# Patient Record
Sex: Female | Born: 1970 | Race: Black or African American | Hispanic: No | Marital: Married | Smoking: Never smoker
Health system: Southern US, Community
[De-identification: ages and names within clinical notes are randomized; demographics above are authoritative.]

## PROBLEM LIST (undated history)

## (undated) DIAGNOSIS — I10 Essential (primary) hypertension: Secondary | ICD-10-CM

## (undated) DIAGNOSIS — G4730 Sleep apnea, unspecified: Secondary | ICD-10-CM

## (undated) DIAGNOSIS — K219 Gastro-esophageal reflux disease without esophagitis: Secondary | ICD-10-CM

## (undated) DIAGNOSIS — G709 Myoneural disorder, unspecified: Secondary | ICD-10-CM

## (undated) DIAGNOSIS — T8859XA Other complications of anesthesia, initial encounter: Secondary | ICD-10-CM

## (undated) DIAGNOSIS — F419 Anxiety disorder, unspecified: Secondary | ICD-10-CM

## (undated) DIAGNOSIS — N189 Chronic kidney disease, unspecified: Secondary | ICD-10-CM

## (undated) DIAGNOSIS — D649 Anemia, unspecified: Secondary | ICD-10-CM

## (undated) DIAGNOSIS — K439 Ventral hernia without obstruction or gangrene: Secondary | ICD-10-CM

## (undated) DIAGNOSIS — M1990 Unspecified osteoarthritis, unspecified site: Secondary | ICD-10-CM

## (undated) DIAGNOSIS — E119 Type 2 diabetes mellitus without complications: Secondary | ICD-10-CM

## (undated) DIAGNOSIS — Z87442 Personal history of urinary calculi: Secondary | ICD-10-CM

## (undated) DIAGNOSIS — J42 Unspecified chronic bronchitis: Secondary | ICD-10-CM

## (undated) HISTORY — DX: Gastro-esophageal reflux disease without esophagitis: K21.9

## (undated) HISTORY — DX: Essential (primary) hypertension: I10

## (undated) HISTORY — DX: Sleep apnea, unspecified: G47.30

## (undated) HISTORY — PX: CYST EXCISION: SHX5701

## (undated) HISTORY — PX: BREAST SURGERY: SHX581

## (undated) HISTORY — DX: Type 2 diabetes mellitus without complications: E11.9

## (undated) HISTORY — PX: SPINE SURGERY: SHX786

---

## 2009-09-17 ENCOUNTER — Ambulatory Visit (HOSPITAL_BASED_OUTPATIENT_CLINIC_OR_DEPARTMENT_OTHER): Admission: RE | Admit: 2009-09-17 | Discharge: 2009-09-18 | Payer: Self-pay | Admitting: Specialist

## 2010-08-28 LAB — POCT I-STAT, CHEM 8
BUN: 9 mg/dL (ref 6–23)
Calcium, Ion: 1.11 mmol/L — ABNORMAL LOW (ref 1.12–1.32)
Chloride: 106 mEq/L (ref 96–112)
Potassium: 3.8 mEq/L (ref 3.5–5.1)

## 2010-08-28 LAB — GLUCOSE, CAPILLARY
Glucose-Capillary: 139 mg/dL — ABNORMAL HIGH (ref 70–99)
Glucose-Capillary: 140 mg/dL — ABNORMAL HIGH (ref 70–99)
Glucose-Capillary: 222 mg/dL — ABNORMAL HIGH (ref 70–99)

## 2014-09-11 ENCOUNTER — Encounter: Payer: Self-pay | Admitting: Dietician

## 2014-09-11 ENCOUNTER — Encounter: Payer: BLUE CROSS/BLUE SHIELD | Attending: Internal Medicine | Admitting: Dietician

## 2014-09-11 VITALS — Ht 64.0 in | Wt 225.0 lb

## 2014-09-11 DIAGNOSIS — Z713 Dietary counseling and surveillance: Secondary | ICD-10-CM | POA: Diagnosis not present

## 2014-09-11 DIAGNOSIS — Z6838 Body mass index (BMI) 38.0-38.9, adult: Secondary | ICD-10-CM | POA: Diagnosis not present

## 2014-09-11 DIAGNOSIS — E1165 Type 2 diabetes mellitus with hyperglycemia: Secondary | ICD-10-CM | POA: Diagnosis not present

## 2014-09-11 DIAGNOSIS — E669 Obesity, unspecified: Secondary | ICD-10-CM | POA: Diagnosis present

## 2014-09-11 DIAGNOSIS — IMO0002 Reserved for concepts with insufficient information to code with codable children: Secondary | ICD-10-CM

## 2014-09-11 NOTE — Patient Instructions (Signed)
Slow down your eating Consider only eating at the table or bar without TV.  Music is fine. Rethink what you drink.  Avoid drinking carbohydrate. Take medication as MD prescribed.  Take Janumet with meals.  Be aware of emotional eating.  What can I do instead? Be mindful of portion sizes. Choose baked instead of fried. Regularly spaced spaced meals and snacks [facility]

## 2014-09-11 NOTE — Progress Notes (Addendum)
  Medical Nutrition Therapy:  Appt start time: 1400 end time:  1530.   Assessment:  Primary concerns today:   Patient would like to get better control of eating  To lose weight and improve blood sugar. Patient with hx of type 2 diabetes since 2006.  Her last HgbA1C was 11.0%.  Patient also has a hx of HTN and vitamin D deficiency.  CBG 265 fasting 2 days ago.  Patient weighed 270 lbs "years ago" and lost to 200 lbs and regained part of her weight.  Patient has considered weight loss surgery.  Patient works 3rd shift at [employer].  Patient lives alone.   TANITA  BODY COMP RESULTS 09/11/14  225 lbs   BMI (kg/m^2) 38.6   Fat Mass (lbs) 112.5   Fat Free Mass (lbs) 112.5   Total Body Water (lbs) 82.5    Preferred Learning Style:   No preference indicated   Learning Readiness:   Ready  MEDICATIONS: see list which includes Janumet, Amaryl, and    DIETARY INTAKE: Patient states that she has been eating a lot lately "just to be eating".  Recent break up with boyfriend.  Patient eats out most of the time.  Portions are large. 24-hr recall:  B ( AM): occasional pancakes, eggs, cheese, bacon or bacon egg and cheese biscuit and hash browns or bacon egg and cheese croissant  Snk ( AM):  L ( PM): 2 hot dogs with chips Snk ( PM):  Snack machine at work- chips or honey bun D ( PM): chicken and gravy and mashed potatoes and gravy and green beans and yams Snk ( PM): chocolate Beverages: dislikes water, regular soda 16 oz bid, sweet tea, milkshakes  Usual physical activity: [employer] membership- water aerobics, treadmill, and track for 30-60 minutes 5-6 days per week.  Estimated energy needs: 1400ories 158carbohydrates 105protein 39 fat  Progress Towards Goal(s):  In progress.   Nutritional Diagnosis:  NB-1.1 Food and nutrition-related knowledge deficit As related to balance of carbohydrate, protein, and fat.  As evidenced by uncontrolled type 2 diabetes.    Intervention:  Nutrition counseling  and diabetes education initiated. Discussed Carb Counting by food group as method of portion control, reading food labels, and benefits of increased activity. Also discussed basic physiology of Diabetes, target BG ranges pre and post meals, and A1c.  Also discussed emotional eating.  Plan: Slow down your eating Consider only eating at the table or bar without TV.  Music is fine. Rethink what you drink.  Avoid drinking carbohydrate. Take medication as MD prescribed.  Take Janumet with meals.  Be aware of emotional eating.  What can I do instead? Be mindful of portion sizes. Choose baked instead of fried. Regularly spaced spaced meals and snacks Continue [facility] the [facility]!    Teaching Method Utilized:  Visual Auditory Hands on  Handouts given during visit include:  My Plate  Meal plan card  Label reading  Snack list  Barriers to learning/adherence to lifestyle change: emotional eating  Demonstrated degree of understanding via:  Teach Back   Monitoring/Evaluation:  Dietary intake, exercise, label reading, and body weight in 1 month(s).

## 2014-10-16 ENCOUNTER — Ambulatory Visit: Payer: BLUE CROSS/BLUE SHIELD | Admitting: Dietician

## 2015-03-09 ENCOUNTER — Other Ambulatory Visit: Payer: Self-pay

## 2015-03-16 ENCOUNTER — Other Ambulatory Visit (HOSPITAL_COMMUNITY): Payer: Self-pay | Admitting: Surgery

## 2015-04-09 ENCOUNTER — Ambulatory Visit: Payer: BLUE CROSS/BLUE SHIELD | Admitting: Dietician

## 2015-04-10 ENCOUNTER — Encounter: Payer: Self-pay | Admitting: Dietician

## 2015-04-10 ENCOUNTER — Encounter: Payer: BLUE CROSS/BLUE SHIELD | Attending: Surgery | Admitting: Dietician

## 2015-04-10 DIAGNOSIS — Z713 Dietary counseling and surveillance: Secondary | ICD-10-CM | POA: Diagnosis not present

## 2015-04-10 DIAGNOSIS — Z6838 Body mass index (BMI) 38.0-38.9, adult: Secondary | ICD-10-CM | POA: Diagnosis not present

## 2015-04-10 NOTE — Progress Notes (Signed)
  Pre-Op Assessment Visit:  Pre-Operative RYGB Surgery  Medical Nutrition Therapy:  Appt start time: 1000   End time:  1045.  Patient was seen on 04/10/2015 for Pre-Operative Nutrition Assessment. Assessment and [facility] coordinator on 04/10/2015.   Preferred Learning Style:   No preference indicated   Learning Readiness:   Ready  Handouts given during visit include:  Pre-Op Goals Bariatric Surgery Protein Shakes Therapists in the area   During the appointment today the following Pre-Op Goals were reviewed with the patient: Maintain or lose weight as instructed by your surgeon Make healthy food choices Begin to limit portion sizes Limited concentrated sugars and fried foods Keep fat/sugar in the single digits per serving on   food labels Practice CHEWING your food  (aim for 30 chews per bite or until applesauce consistency) Practice not drinking 15 minutes before, during, and 30 minutes after each meal/snack Avoid all carbonated beverages  Avoid/limit caffeinated beverages  Avoid all sugar-sweetened beverages Consume 3 meals per day; eat every 3-5 hours Make a list of non-food related activities Aim for 64-100 ounces of FLUID daily  Aim for at least 60-80 grams of PROTEIN daily Look for a liquid protein source that contain ?15 g protein and ?5 g carbohydrate  (ex: shakes, drinks, shots)  Patient-Centered Goals: Goals: "get rid of diabetes", breath better, come off of sleep apnea machine, overall improve health  10 level of confidence/10 level of importance scale   Demonstrated degree of understanding via:  Teach Back  Teaching Method Utilized:  Visual Auditory Hands on  Barriers to learning/adherence to lifestyle change: emotional   Patient to [facility] the [facility] to enroll in Pre-Op and Post-Op Nutrition Education when surgery date is scheduled.

## 2015-04-10 NOTE — Patient Instructions (Signed)

## 2015-04-25 ENCOUNTER — Ambulatory Visit: Payer: BLUE CROSS/BLUE SHIELD | Admitting: Dietician

## 2015-05-14 ENCOUNTER — Encounter: Payer: BLUE CROSS/BLUE SHIELD | Attending: Surgery | Admitting: Dietician

## 2015-05-14 ENCOUNTER — Encounter: Payer: Self-pay | Admitting: Dietician

## 2015-05-14 DIAGNOSIS — Z713 Dietary counseling and surveillance: Secondary | ICD-10-CM | POA: Diagnosis not present

## 2015-05-14 DIAGNOSIS — Z6838 Body mass index (BMI) 38.0-38.9, adult: Secondary | ICD-10-CM | POA: Diagnosis not present

## 2015-05-14 NOTE — Progress Notes (Signed)
Supervised Weight Loss Class:  Appt start time: 1600   End time:  1630.  Patient was seen on 05/14/2015 for the "Protein" Supervised Weight Loss Class at [facility].   Surgery type: RYGB Start weight at [facility]: 225 lbs Weight today: 229.9 lbs Weight change: 5 lbs gain   The following learning objectives were met by the patient during this class:  Objectives: -Discuss importance of protein after surgery  -Promotes healing  -Helps preserve lean mass (muscles and organs) as you lose fat -Identify foods that contain protein -Discuss recommended daily protein goals for men and women after surgery -Educate patients on body composition scale and purpose   Goals: -Begin to be mindful of how much protein is in the foods you are eating -Know how much protein is recommended daily after surgery -Begin having a protein food with each meal and snack  Handouts given: "Protein" brochure

## 2015-06-22 ENCOUNTER — Encounter: Payer: Self-pay | Admitting: Skilled Nursing Facility1

## 2015-06-22 ENCOUNTER — Encounter: Payer: BLUE CROSS/BLUE SHIELD | Attending: Surgery | Admitting: Skilled Nursing Facility1

## 2015-06-22 DIAGNOSIS — Z6838 Body mass index (BMI) 38.0-38.9, adult: Secondary | ICD-10-CM | POA: Insufficient documentation

## 2015-06-22 DIAGNOSIS — Z713 Dietary counseling and surveillance: Secondary | ICD-10-CM | POA: Diagnosis not present

## 2015-06-22 NOTE — Progress Notes (Signed)
Supervised Weight Loss Class:  Appt start time: 11:00   End time:  11:15  Pt states she visited the ED and had Blood sugars in the 300 with low blood pressure. Pt states she has been more out of breath than is usual for her. Dietitian conducted a limited nutrition focused physical exam in which no edema was felt but her Stomach palpated as tight which the pt states is not normal for her. Dietitian advised she continue monitoring her signs/symptoms and if there is any change to go to ED or urgent care. Dietitian advised the pt to avoid all caffeine over the weekend and limit her fluid intake to sipping throughout the day; just until she sees her cardiologist this coming Monday.   Pt states she has not started looking for a protein shake: dietitian advised she work on finding one she likes. Pt states she has been inadvertently working on chewing more just she has been having so much trouble breathing and is so fatigued.  Dietitian discussed physical activity with the pt but advised she do not start anything until she sees her physician this coming Monday. Dietitian advised she start working on decreasing her chocolate/carbohydrate intake.   Surgery type: RYGB Start weight at [facility]: 225 lbs Weight today: 226 lbs Weight change:  3 lbs loss   The following learning objectives were met by the patient during this class:  Objectives: -Discuss importance of protein after surgery  -Promotes healing  -Helps preserve lean mass (muscles and organs) as you lose fat -Identify foods that contain protein -Discuss recommended daily protein goals for men and women after surgery -How much water is needed and when to drink  Goals: -Begin to be mindful of how much protein is in the foods you are eating -Begin having a protein food with each meal and snack -Focus on current health problems until the physician is seen  Handouts given: "Protein" shake brochure Fluid handout

## 2015-07-16 ENCOUNTER — Encounter: Payer: BLUE CROSS/BLUE SHIELD | Attending: Surgery | Admitting: Skilled Nursing Facility1

## 2015-07-16 ENCOUNTER — Encounter: Payer: Self-pay | Admitting: Skilled Nursing Facility1

## 2015-07-16 DIAGNOSIS — Z713 Dietary counseling and surveillance: Secondary | ICD-10-CM | POA: Diagnosis not present

## 2015-07-16 DIAGNOSIS — Z6838 Body mass index (BMI) 38.0-38.9, adult: Secondary | ICD-10-CM | POA: Diagnosis not present

## 2015-07-16 NOTE — Patient Instructions (Signed)
-  Check your blood glucose 2 hours after you eat [employer]  -Start planning your meals for the week -Smoothie idea: premier protein shake, frozen fruit, lettuce -check your blood sugar twice a day at different times throughout the week -stay mindful of surgery recomendations

## 2015-07-16 NOTE — Progress Notes (Signed)
Supervised Weight Loss Class:  Appt start time: 4:30   End time:  4:45pm  Pt states she had a stressed test but has not gotten the results yet. Pt states her doctor has lowered her blood pressure medicine dose. Pt states she has had more energy since using her sleep apnea machine more often and moving more. Pt states she is working on drinking more water but has started drinking more soda. Pt states she is going to start going to the gym after work. Pt states lately she has an insatiable appetite; cannot seem to get full. Pt has been consuming [employer] fields often and choosing the Biggest Big Mac they currently have as well as chicken nuggets. Pt states she does not check her glucose: dietitian educated the pt on the importance of checking her blood sugar.   Surgery type: RYGB Start weight at [facility]: 225 lbs Weight today: 225 lbs Weight change:  none   The following learning objectives were met by the patient during this class:  Objectives: -Discuss importance of protein after surgery  -Promotes healing  -Helps preserve lean mass (muscles and organs) as you lose fat -Identify foods that contain protein -Discuss recommended daily protein goals for men and women after surgery -How much water is needed and when to drink  Goals: -Check your blood glucose 2 hours after you eat [employer]  -Start planning your meals for the week -Smoothie idea: premier protein shake, frozen fruit, lettuce -check your blood sugar twice a day at different times throughout the week -stay mindful of surgery recomendations   Next appointment: 4 weeks from now 

## 2015-08-13 ENCOUNTER — Encounter: Payer: BLUE CROSS/BLUE SHIELD | Attending: Surgery | Admitting: Dietician

## 2015-08-13 ENCOUNTER — Encounter: Payer: Self-pay | Admitting: Dietician

## 2015-08-13 DIAGNOSIS — Z713 Dietary counseling and surveillance: Secondary | ICD-10-CM | POA: Diagnosis not present

## 2015-08-13 DIAGNOSIS — Z6838 Body mass index (BMI) 38.0-38.9, adult: Secondary | ICD-10-CM | POA: Insufficient documentation

## 2015-08-13 NOTE — Progress Notes (Signed)
Supervised Weight Loss Class:  Appt start time: 1600   End time:  1630.  Patient was seen on 08/13/2015 for the "Exercise" Supervised Weight Loss Class at the [facility].   Surgery type: RYGB Start weight at [facility]: 225 lbs Weight today: 223.8 lbs Weight change:  1.5 lbs loss  The following learning objectives were met by the patient during this class:  Objectives: -Discuss importance of exercise  -Helps burn calories  -Preserves/builds lean mass  -Stress relief  -Weight maintenance -Explain BELT program details  Goals: -Find an exercise/activity that you enjoy -Begin to develop an exercise routine  Handouts given: "Exercise" pamphlet

## 2015-08-23 ENCOUNTER — Other Ambulatory Visit (HOSPITAL_COMMUNITY): Payer: Self-pay | Admitting: Surgery

## 2015-08-23 DIAGNOSIS — E139 Other specified diabetes mellitus without complications: Secondary | ICD-10-CM

## 2015-09-10 ENCOUNTER — Ambulatory Visit (HOSPITAL_COMMUNITY)
Admission: RE | Admit: 2015-09-10 | Discharge: 2015-09-10 | Disposition: A | Discharge status: Home / Self Care | Payer: BLUE CROSS/BLUE SHIELD | Source: Ambulatory Visit | Attending: Surgery | Admitting: Surgery

## 2015-09-10 ENCOUNTER — Other Ambulatory Visit: Payer: Self-pay

## 2015-09-10 ENCOUNTER — Other Ambulatory Visit (HOSPITAL_COMMUNITY): Payer: Self-pay | Admitting: Surgery

## 2015-09-10 ENCOUNTER — Encounter: Payer: BLUE CROSS/BLUE SHIELD | Attending: Surgery | Admitting: Dietician

## 2015-09-10 DIAGNOSIS — E139 Other specified diabetes mellitus without complications: Secondary | ICD-10-CM

## 2015-09-10 DIAGNOSIS — R918 Other nonspecific abnormal finding of lung field: Secondary | ICD-10-CM | POA: Insufficient documentation

## 2015-09-10 DIAGNOSIS — Z01818 Encounter for other preprocedural examination: Secondary | ICD-10-CM | POA: Insufficient documentation

## 2015-09-10 DIAGNOSIS — Z6838 Body mass index (BMI) 38.0-38.9, adult: Secondary | ICD-10-CM | POA: Insufficient documentation

## 2015-09-10 DIAGNOSIS — Z713 Dietary counseling and surveillance: Secondary | ICD-10-CM | POA: Diagnosis not present

## 2015-09-10 DIAGNOSIS — K449 Diaphragmatic hernia without obstruction or gangrene: Secondary | ICD-10-CM | POA: Diagnosis not present

## 2015-09-13 ENCOUNTER — Encounter: Payer: Self-pay | Admitting: Dietician

## 2015-09-13 NOTE — Progress Notes (Signed)
Supervised Weight Loss Class:  Appt start time: 1600   End time:  1630.  Patient was seen on 09/10/2015 for the "Vitamins" Supervised Weight Loss Class at the [facility].   Surgery type: RYGB Start weight at [facility]: 225 lbs Weight today: 219.5 lbs Weight change:  4.3 lbs loss  The following learning objectives were met by the patient during this class:  Objectives: -Review vitamin and Calcium recommendations based on procedure -Identify appropriate types/brands of vitamins and Calcium -Reinforce the need for chewable or liquid supplements  Goals: -Find appropriate supplements that meet taste and budget needs -Begin taking a recommended multivitamin and Calcium supplement  Handouts given: "Vitamins" pamphlet

## 2015-10-08 ENCOUNTER — Encounter: Payer: Self-pay | Admitting: Dietician

## 2015-10-08 ENCOUNTER — Encounter: Payer: BLUE CROSS/BLUE SHIELD | Attending: Surgery | Admitting: Dietician

## 2015-10-08 DIAGNOSIS — Z6838 Body mass index (BMI) 38.0-38.9, adult: Secondary | ICD-10-CM | POA: Insufficient documentation

## 2015-10-08 DIAGNOSIS — Z713 Dietary counseling and surveillance: Secondary | ICD-10-CM | POA: Diagnosis not present

## 2015-10-08 NOTE — Progress Notes (Signed)
Supervised Weight Loss Class:  Appt start time: 1600   End time:  1630.  Patient was seen on 10/08/2015 for the "Mindful Eating" Supervised Weight Loss Class at the [facility].    Surgery type: RYGB Start weight at [facility]: 225 lbs Weight today: 219.2 lbs Weight change:  0 lbs   The following learning objectives were met by the patient during this class:  Objectives: -Discuss masticating foods to appropriate consistency  -Helps food fit in pouch and prevents pain and vomiting  -Eating slowly and taking tiny bites help brain register fullness -Encourage patients to begin to think about what diet will be like after surgery -Encourage patients to begin to consider portion sizes and listen to hunger/fullness cues -Review importance of regular meals and snacks -Discuss diet advancement after surgery -Review causes and symptoms of dumping syndrome  Goals: -Begin limiting fried and other high fat foods and foods high in sugar -Eat proteins first, then vegetables, then starch -Practice chewing foods to applesauce consistency -Aim to spend 20 minutes eating meals -Work on eating 3 meals a day (or every 3-5 hours) if you are not already doing so  Handouts given: Mindful Eating brochure

## 2016-03-17 ENCOUNTER — Encounter: Payer: BLUE CROSS/BLUE SHIELD | Attending: Surgery | Admitting: Dietician

## 2016-03-17 DIAGNOSIS — Z6838 Body mass index (BMI) 38.0-38.9, adult: Secondary | ICD-10-CM | POA: Diagnosis not present

## 2016-03-17 NOTE — Progress Notes (Signed)
  Pre-Operative Nutrition Class:  Appt start time: 7062   End time:  1830.  Patient was seen on 03/17/2016 for Pre-Operative Bariatric Surgery Education at the [facility].   Surgery date:  Surgery type: RYGB Start weight at [facility]: 225 lbs on 09/11/2014 Weight today: 222.2 lbs  TANITA  BODY COMP RESULTS  03/17/16   BMI (kg/m^2) 38.1   Fat Mass (lbs) 110.2   Fat Free Mass (lbs) 112   Total Body Water (lbs) 81.4   Samples given per MNT protocol. Patient educated on appropriate usage: Premier protein shake (vanilla - qty 1) Lot #: [id number] Exp: 01/2017  Bariatric Advantage Calcium Citrate chew (strawberry - qty 1) Lot #: [id number] Exp: 10/2016  Renee Pain Protein Powder (chocolate - qty 1) Lot #: [id number] Exp: 06/2017  The following the learning objectives were met by the patient during this course:  Identify Pre-Op Dietary Goals and will begin 2 weeks pre-operatively  Identify appropriate sources of fluids and proteins   State protein recommendations and appropriate sources pre and post-operatively  Identify Post-Operative Dietary Goals and will follow for 2 weeks post-operatively  Identify appropriate multivitamin and calcium sources  Describe the need for physical activity post-operatively and will follow MD recommendations  State when to call healthcare provider regarding medication questions or post-operative complications  Handouts given during class include:  Pre-Op Bariatric Surgery Diet Handout  Protein Shake Handout  Post-Op Bariatric Surgery Nutrition Handout  BELT Program Information Flyer  Support Group Information Flyer  [facility] Bariatric Supplements Price List  Follow-Up Plan: Patient will follow-up at [facility] 2 weeks post operatively for diet advancement per MD.

## 2016-03-18 ENCOUNTER — Encounter: Payer: Self-pay | Admitting: Dietician

## 2016-04-22 NOTE — Progress Notes (Signed)
Scheduling pre op- please PLACE SURGICAL ORDERS IN EPIC  thanks 

## 2016-05-09 NOTE — Progress Notes (Signed)
Surgery on 05/19/2016.  Preop on 05/14/2016.  Need orders in EPIC.  Thank You.

## 2016-05-13 ENCOUNTER — Ambulatory Visit: Payer: Self-pay | Admitting: Surgery

## 2016-05-14 ENCOUNTER — Encounter (HOSPITAL_COMMUNITY)
Admission: RE | Admit: 2016-05-14 | Discharge: 2016-05-14 | Disposition: A | Discharge status: Home / Self Care | Payer: BLUE CROSS/BLUE SHIELD | Source: Ambulatory Visit | Attending: Surgery | Admitting: Surgery

## 2016-05-14 ENCOUNTER — Encounter (HOSPITAL_COMMUNITY): Payer: Self-pay

## 2016-05-14 ENCOUNTER — Encounter (INDEPENDENT_AMBULATORY_CARE_PROVIDER_SITE_OTHER): Payer: Self-pay

## 2016-05-14 DIAGNOSIS — E669 Obesity, unspecified: Secondary | ICD-10-CM | POA: Insufficient documentation

## 2016-05-14 DIAGNOSIS — Z01812 Encounter for preprocedural laboratory examination: Secondary | ICD-10-CM | POA: Insufficient documentation

## 2016-05-14 HISTORY — DX: Unspecified chronic bronchitis: J42

## 2016-05-14 HISTORY — DX: Ventral hernia without obstruction or gangrene: K43.9

## 2016-05-14 HISTORY — DX: Myoneural disorder, unspecified: G70.9

## 2016-05-14 LAB — CBC WITH DIFFERENTIAL/PLATELET
Basophils Absolute: 0 10*3/uL (ref 0.0–0.1)
Basophils Relative: 0 %
EOS ABS: 0 10*3/uL (ref 0.0–0.7)
Eosinophils Relative: 1 %
HEMATOCRIT: 37.2 % (ref 36.0–46.0)
HEMOGLOBIN: 12.5 g/dL (ref 12.0–15.0)
LYMPHS ABS: 2.3 10*3/uL (ref 0.7–4.0)
Lymphocytes Relative: 26 %
MCH: 27 pg (ref 26.0–34.0)
MCHC: 33.6 g/dL (ref 30.0–36.0)
MCV: 80.3 fL (ref 78.0–100.0)
MONOS PCT: 5 %
Monocytes Absolute: 0.4 10*3/uL (ref 0.1–1.0)
NEUTROS ABS: 6.1 10*3/uL (ref 1.7–7.7)
NEUTROS PCT: 68 %
Platelets: 373 10*3/uL (ref 150–400)
RBC: 4.63 MIL/uL (ref 3.87–5.11)
RDW: 14 % (ref 11.5–15.5)
WBC: 8.8 10*3/uL (ref 4.0–10.5)

## 2016-05-14 LAB — COMPREHENSIVE METABOLIC PANEL
ALK PHOS: 71 U/L (ref 38–126)
ALT: 23 U/L (ref 14–54)
ANION GAP: 9 (ref 5–15)
AST: 20 U/L (ref 15–41)
Albumin: 3.5 g/dL (ref 3.5–5.0)
BILIRUBIN TOTAL: 0.6 mg/dL (ref 0.3–1.2)
BUN: 15 mg/dL (ref 6–20)
CALCIUM: 9.4 mg/dL (ref 8.9–10.3)
CO2: 24 mmol/L (ref 22–32)
Chloride: 98 mmol/L — ABNORMAL LOW (ref 101–111)
Creatinine, Ser: 0.61 mg/dL (ref 0.44–1.00)
Glucose, Bld: 269 mg/dL — ABNORMAL HIGH (ref 65–99)
Potassium: 3.7 mmol/L (ref 3.5–5.1)
Sodium: 131 mmol/L — ABNORMAL LOW (ref 135–145)
TOTAL PROTEIN: 8.1 g/dL (ref 6.5–8.1)

## 2016-05-14 LAB — GLUCOSE, CAPILLARY: Glucose-Capillary: 240 mg/dL — ABNORMAL HIGH (ref 65–99)

## 2016-05-14 NOTE — Patient Instructions (Signed)
Tally JoeBrenzle N Kostka  05/14/2016   Your procedure is scheduled on: 05-19-16  [facility]  [facility] to 3rd floor to  Short Stay Center at  0515 AM.  Call this number if you have problems the morning of surgery [phone] Follow Bowel prep instructions per MD. Bring cpap mask/tubing.  Remember: ONLY 1 PERSON MAY GO WITH YOU TO SHORT STAY TO GET  READY MORNING OF YOUR SURGERY.  Do not eat food or drink liquids :After Midnight.     Take these medicines the morning of surgery with A SIP OF WATER: None- use Inhaler-if need. DO NOT TAKE ANY DIABETIC MEDICATIONS DAY OF YOUR SURGERY                               You may not have any metal on your body including hair pins and              piercings  Do not wear jewelry, make-up, lotions, powders or perfumes, deodorant             Do not wear nail polish.  Do not shave  48 hours prior to surgery.              Men may shave face and neck.   Do not bring valuables to the hospital. [place] IS NOT             RESPONSIBLE   FOR VALUABLES.  Contacts, dentures or bridgework may not be worn into surgery.  Leave suitcase in the car. After surgery it may be brought to your room.     Patients discharged the day of surgery will not be allowed to drive home.  Name and phone number of your driver:Ray Wartman-father [phone] cell  Special Instructions: N/A              Please read over the following fact sheets you were given: _____________________________________________________________________             [facility] - Preparing for Surgery Before surgery, you can play an important role.  Because skin is not sterile, your skin needs to be as free of germs as possible.  You can reduce the number of germs on your skin by washing with CHG (chlorahexidine gluconate) soap before surgery.  CHG is an antiseptic cleaner which kills germs and bonds with the skin to continue killing germs even  after washing. Please DO NOT use if you have an allergy to CHG or antibacterial soaps.  If your skin becomes reddened/irritated stop using the CHG and inform your nurse when you arrive at Short Stay. Do not shave (including legs and underarms) for at least 48 hours prior to the first CHG shower.  You may shave your face/neck. Please follow these instructions carefully:  1.  Shower with CHG Soap the night before surgery and the  morning of Surgery.  2.  If you choose to wash your hair, wash your hair first as usual with your  normal  shampoo.  3.  After you shampoo, rinse your hair and body thoroughly to remove the  shampoo.                           4.  Use CHG as you would any other liquid  soap.  You can apply chg directly  to the skin and wash                       Gently with a scrungie or clean washcloth.  5.  Apply the CHG Soap to your body ONLY FROM THE NECK DOWN.   Do not use on face/ open                           Wound or open sores. Avoid contact with eyes, ears mouth and genitals (private parts).                       Wash face,  Genitals (private parts) with your normal soap.             6.  Wash thoroughly, paying special attention to the area where your surgery  will be performed.  7.  Thoroughly rinse your body with warm water from the neck down.  8.  DO NOT shower/wash with your normal soap after using and rinsing off  the CHG Soap.                9.  Pat yourself dry with a clean towel.            10.  Wear clean pajamas.            11.  Place clean sheets on your bed the night of your first shower and do not  sleep with pets. Day of Surgery : Do not apply any lotions/deodorants the morning of surgery.  Please wear clean clothes to the hospital/surgery center.  FAILURE TO FOLLOW THESE INSTRUCTIONS MAY RESULT IN THE CANCELLATION OF YOUR SURGERY ________________________________________________________________________   How to Manage Your Diabetes Before and After  Surgery  Why is it important to control my blood sugar before and after surgery? . Improving blood sugar levels before and after surgery helps healing and can limit problems. . A way of improving blood sugar control is eating a healthy diet by: o  Eating less sugar and carbohydrates o  Increasing activity/exercise o  Talking with your doctor about reaching your blood sugar goals . High blood sugars (greater than 180 mg/dL) can raise your risk of infections and slow your recovery, so you will need to focus on controlling your diabetes during the weeks before surgery. . Make sure that the doctor who takes care of your diabetes knows about your planned surgery including the date and location.  How do I manage my blood sugar before surgery? . Check your blood sugar at least 4 times a day, starting 2 days before surgery, to make sure that the level is not too high or low. o Check your blood sugar the morning of your surgery when you wake up and every 2 hours until you get to the Short Stay unit. . If your blood sugar is less than 70 mg/dL, you will need to treat for low blood sugar: o Do not take insulin. o Treat a low blood sugar (less than 70 mg/dL) with  cup of clear juice (cranberry or apple), 4 glucose tablets, OR glucose gel. o Recheck blood sugar in 15 minutes after treatment (to make sure it is greater than 70 mg/dL). If your blood sugar is not greater than 70 mg/dL on recheck, call [phone] for further instructions. . Report your blood sugar to the short stay nurse when  you get to Short Stay.  . If you are admitted to the hospital after surgery: o Your blood sugar will be checked by the staff and you will probably be given insulin after surgery (instead of oral diabetes medicines) to make sure you have good blood sugar levels. o The goal for blood sugar control after surgery is 80-180 mg/dL.   WHAT DO I DO ABOUT MY DIABETES MEDICATION?  Marland Kitchen Do not take oral diabetes medicines  (pills) the morning of surgery.  Patient Signature:  Date:   Nurse Signature:  Date:   Reviewed and Endorsed by [facility] Patient Education Committee, August 2015

## 2016-05-14 NOTE — Pre-Procedure Instructions (Signed)
EKG, CXR 4'17 Epic.

## 2016-05-15 LAB — HEMOGLOBIN A1C
Hgb A1c MFr Bld: 11.9 % — ABNORMAL HIGH (ref 4.8–5.6)
Mean Plasma Glucose: 295 mg/dL

## 2016-05-15 NOTE — Progress Notes (Signed)
05-15-16 0755 Hgb A1C =11.9 with labs done 05-14-16 viewable in Epic.

## 2016-05-18 NOTE — Anesthesia Preprocedure Evaluation (Addendum)
Anesthesia Evaluation  Patient identified by MRN, date of birth, ID band Patient awake    Reviewed: Allergy & Precautions, NPO status , Patient's Chart, lab work & pertinent test results  Airway Mallampati: III  TM Distance: >3 FB Neck ROM: Full    Dental  (+) Dental Advisory Given   Pulmonary sleep apnea ,    breath sounds clear to auscultation       Cardiovascular hypertension, Pt. on medications and Pt. on home beta blockers  Rhythm:Regular Rate:Normal     Neuro/Psych  Neuromuscular disease    GI/Hepatic Neg liver ROS, GERD  ,  Endo/Other  diabetes, Poorly Controlled, Type 2  Renal/GU negative Renal ROS     Musculoskeletal   Abdominal   Peds  Hematology negative hematology ROS (+)   Anesthesia Other Findings   Reproductive/Obstetrics                            Lab Results  Component Value Date   WBC 8.8 05/14/2016   HGB 12.5 05/14/2016   HCT 37.2 05/14/2016   MCV 80.3 05/14/2016   PLT 373 05/14/2016   Lab Results  Component Value Date   CREATININE 0.61 05/14/2016   BUN 15 05/14/2016   NA 131 (L) 05/14/2016   K 3.7 05/14/2016   CL 98 (L) 05/14/2016   CO2 24 05/14/2016    Anesthesia Physical Anesthesia Plan  ASA: II  Anesthesia Plan: General   Post-op Pain Management:    Induction: Intravenous  Airway Management Planned: Oral ETT  Additional Equipment:   Intra-op Plan:   Post-operative Plan: Extubation in OR  Informed Consent: I have reviewed the patients History and Physical, chart, labs and discussed the procedure including the risks, benefits and alternatives for the proposed anesthesia with the patient or authorized representative who has indicated his/her understanding and acceptance.   Dental advisory given  Plan Discussed with: CRNA  Anesthesia Plan Comments:        Anesthesia Quick Evaluation

## 2016-05-18 NOTE — H&P (Signed)
Rachel Stewart 03/17/2016 3:53 PM Location: [facility] Patient #: [id number] DOB: 12/28/1970 Single / Language: [place] / Race: Black or African American Female   History of Present Illness Molli Hazard(Rhet Rorke B. Daphine DeutscherMartin MD; 03/17/2016 4:41 PM) Patient words: 45 year old  returns for preop lap roux en Y gastric bypass surgery. No prior abdominal surgery. No hx of DVT. GER that is treated and small type I hiatal hernia noted on UGI.   I went over the procedure again with her and her girlfriend. Questions about sleeve and bypass answered. Script given for pain meds. Surgery approval pending and she will see Neysa BonitoChristy today.   Allergies (April Staton, CMA; 03/17/2016 3:55 PM) No Known Drug Allergies 03/09/2015  Medication History (April Staton, [place]; 03/17/2016 4:02 PM) TraMADol HCl (50MG  Tablet, Oral) Active. AmLODIPine Besylate (10MG  Tablet, Oral) Active. Chlorhexidine Gluconate (0.12% Solution, Mouth/Throat) Active. Clindamycin HCl (300MG  Capsule, Oral) Active. Jentadueto XR (2.5-1000MG  Tablet ER 24HR, Oral) Active. Metoprolol Succinate ER (50MG  Tablet ER 24HR, Oral) Active. Losartan Potassium-HCTZ (100-12.5MG  Tablet, Oral) Active. Omeprazole (20MG  Capsule DR, Oral) Active. Glimepiride (4MG  Tablet, Oral) Active. Medications Reconciled  Vitals (April Staton CMA; 03/17/2016 4:03 PM) 03/17/2016 4:02 PM Weight: 223.13 lb Height: 64in Height was reported by patient. Body Surface Area: 2.05 m Body Mass Index: 38.3 kg/m  Temp.: 98.104F(Oral)  Pulse: 96 (Regular)  BP: 132/98 (Sitting, Left Arm, Standard)       Physical Exam (Andriana Casa B. Daphine DeutscherMartin MD; 03/17/2016 4:41 PM) General Note: HEENT unremarkable Neck supple Chest clear Heart SR without murmurs Ext FROM     Assessment & Plan Molli Hazard(Durinda Buzzelli B. Daphine DeutscherMartin MD; 03/17/2016 4:39 PM) MORBID OBESITY (E66.01) BMI 38  For roux en Y gastric bypass]  Matt B. Daphine DeutscherMartin, MD, FACS

## 2016-05-19 ENCOUNTER — Inpatient Hospital Stay (HOSPITAL_COMMUNITY)
Admission: RE | Admit: 2016-05-19 | Discharge: 2016-05-21 | DRG: 621 | Disposition: A | Discharge status: Home / Self Care | Payer: BLUE CROSS/BLUE SHIELD | Source: Ambulatory Visit | Attending: Surgery | Admitting: Surgery

## 2016-05-19 ENCOUNTER — Inpatient Hospital Stay (HOSPITAL_COMMUNITY): Payer: BLUE CROSS/BLUE SHIELD | Admitting: Anesthesiology

## 2016-05-19 ENCOUNTER — Encounter (HOSPITAL_COMMUNITY)
Admission: RE | Disposition: A | Discharge status: Home / Self Care | Payer: Self-pay | Source: Ambulatory Visit | Attending: Surgery

## 2016-05-19 ENCOUNTER — Encounter (HOSPITAL_COMMUNITY): Payer: Self-pay | Admitting: *Deleted

## 2016-05-19 DIAGNOSIS — E66811 Obesity, class 1: Secondary | ICD-10-CM | POA: Diagnosis present

## 2016-05-19 DIAGNOSIS — Z6838 Body mass index (BMI) 38.0-38.9, adult: Secondary | ICD-10-CM | POA: Diagnosis not present

## 2016-05-19 DIAGNOSIS — E119 Type 2 diabetes mellitus without complications: Secondary | ICD-10-CM | POA: Diagnosis present

## 2016-05-19 DIAGNOSIS — K219 Gastro-esophageal reflux disease without esophagitis: Secondary | ICD-10-CM | POA: Diagnosis present

## 2016-05-19 DIAGNOSIS — K449 Diaphragmatic hernia without obstruction or gangrene: Secondary | ICD-10-CM | POA: Diagnosis present

## 2016-05-19 DIAGNOSIS — E669 Obesity, unspecified: Secondary | ICD-10-CM | POA: Diagnosis present

## 2016-05-19 DIAGNOSIS — Z9884 Bariatric surgery status: Secondary | ICD-10-CM

## 2016-05-19 HISTORY — PX: LAPAROSCOPIC ROUX-EN-Y GASTRIC BYPASS WITH HIATAL HERNIA REPAIR: SHX6513

## 2016-05-19 LAB — CBC
HEMATOCRIT: 35.1 % — AB (ref 36.0–46.0)
HEMOGLOBIN: 11.3 g/dL — AB (ref 12.0–15.0)
MCH: 27.1 pg (ref 26.0–34.0)
MCHC: 32.2 g/dL (ref 30.0–36.0)
MCV: 84.2 fL (ref 78.0–100.0)
Platelets: 358 10*3/uL (ref 150–400)
RBC: 4.17 MIL/uL (ref 3.87–5.11)
RDW: 14.6 % (ref 11.5–15.5)
WBC: 19.4 10*3/uL — ABNORMAL HIGH (ref 4.0–10.5)

## 2016-05-19 LAB — GLUCOSE, CAPILLARY
GLUCOSE-CAPILLARY: 238 mg/dL — AB (ref 65–99)
GLUCOSE-CAPILLARY: 271 mg/dL — AB (ref 65–99)
GLUCOSE-CAPILLARY: 248 mg/dL — AB (ref 65–99)
GLUCOSE-CAPILLARY: 220 mg/dL — AB (ref 65–99)
Glucose-Capillary: 167 mg/dL — ABNORMAL HIGH (ref 65–99)
Glucose-Capillary: 266 mg/dL — ABNORMAL HIGH (ref 65–99)
Glucose-Capillary: 250 mg/dL — ABNORMAL HIGH (ref 65–99)
Glucose-Capillary: 211 mg/dL — ABNORMAL HIGH (ref 65–99)
Glucose-Capillary: 217 mg/dL — ABNORMAL HIGH (ref 65–99)

## 2016-05-19 LAB — CREATININE, SERUM: CREATININE: 0.9 mg/dL (ref 0.44–1.00)

## 2016-05-19 LAB — PREGNANCY, URINE: PREG TEST UR: NEGATIVE

## 2016-05-19 SURGERY — CREATION, GASTRIC BYPASS, LAPAROSCOPIC, USING ROUX-EN-Y GASTROENTEROSTOMY, WITH HIATAL HERNIA REPAIR
Anesthesia: General

## 2016-05-19 MED ORDER — INSULIN ASPART 100 UNIT/ML ~~LOC~~ SOLN
SUBCUTANEOUS | Status: AC
  Filled 2016-05-19: qty 1

## 2016-05-19 MED ORDER — ROCURONIUM BROMIDE 10 MG/ML (PF) SYRINGE
PREFILLED_SYRINGE | INTRAVENOUS | Status: DC | PRN
  Administered 2016-05-19: 5 mg via INTRAVENOUS
  Administered 2016-05-19: 50 mg via INTRAVENOUS
  Administered 2016-05-19: 20 mg via INTRAVENOUS

## 2016-05-19 MED ORDER — LACTATED RINGERS IV SOLN
INTRAVENOUS | Status: DC | PRN
  Administered 2016-05-19 (×3): via INTRAVENOUS

## 2016-05-19 MED ORDER — PANTOPRAZOLE SODIUM 40 MG IV SOLR
40.0000 mg | Freq: Every day | INTRAVENOUS | Status: DC
  Administered 2016-05-19 – 2016-05-20 (×2): 40 mg via INTRAVENOUS
  Filled 2016-05-19 (×2): qty 40

## 2016-05-19 MED ORDER — KETAMINE HCL 10 MG/ML IJ SOLN
INTRAMUSCULAR | Status: AC
  Filled 2016-05-19: qty 1

## 2016-05-19 MED ORDER — MIDAZOLAM HCL 2 MG/2ML IJ SOLN
INTRAMUSCULAR | Status: AC
  Filled 2016-05-19: qty 2

## 2016-05-19 MED ORDER — PHENYLEPHRINE 40 MCG/ML (10ML) SYRINGE FOR IV PUSH (FOR BLOOD PRESSURE SUPPORT)
PREFILLED_SYRINGE | INTRAVENOUS | Status: DC | PRN
  Administered 2016-05-19: 120 ug via INTRAVENOUS
  Administered 2016-05-19: 80 ug via INTRAVENOUS
  Administered 2016-05-19: 120 ug via INTRAVENOUS
  Administered 2016-05-19 (×2): 80 ug via INTRAVENOUS

## 2016-05-19 MED ORDER — ACETAMINOPHEN 160 MG/5ML PO SOLN
325.0000 mg | ORAL | Status: DC | PRN
  Administered 2016-05-21: 325 mg via ORAL
  Filled 2016-05-19: qty 20.3

## 2016-05-19 MED ORDER — 0.9 % SODIUM CHLORIDE (POUR BTL) OPTIME
TOPICAL | Status: DC | PRN
  Administered 2016-05-19: 1000 mL

## 2016-05-19 MED ORDER — OXYCODONE HCL 5 MG/5ML PO SOLN
5.0000 mg | ORAL | Status: DC | PRN
  Administered 2016-05-20 – 2016-05-21 (×3): 5 mg via ORAL
  Filled 2016-05-19 (×3): qty 5

## 2016-05-19 MED ORDER — PHENYLEPHRINE 40 MCG/ML (10ML) SYRINGE FOR IV PUSH (FOR BLOOD PRESSURE SUPPORT)
PREFILLED_SYRINGE | INTRAVENOUS | Status: AC
  Filled 2016-05-19: qty 10

## 2016-05-19 MED ORDER — PROMETHAZINE HCL 25 MG/ML IJ SOLN
6.2500 mg | INTRAMUSCULAR | Status: DC | PRN
  Administered 2016-05-19: 6.25 mg via INTRAVENOUS

## 2016-05-19 MED ORDER — INSULIN ASPART 100 UNIT/ML ~~LOC~~ SOLN
SUBCUTANEOUS | Status: DC | PRN
  Administered 2016-05-19: 4 [IU] via INTRAVENOUS
  Administered 2016-05-19: 6 [IU] via INTRAVENOUS
  Administered 2016-05-19: 8 [IU] via INTRAVENOUS

## 2016-05-19 MED ORDER — SUCCINYLCHOLINE CHLORIDE 200 MG/10ML IV SOSY
PREFILLED_SYRINGE | INTRAVENOUS | Status: AC
  Filled 2016-05-19: qty 10

## 2016-05-19 MED ORDER — INSULIN ASPART 100 UNIT/ML ~~LOC~~ SOLN
0.0000 [IU] | SUBCUTANEOUS | Status: DC
  Administered 2016-05-19: 4 [IU] via SUBCUTANEOUS
  Administered 2016-05-19 (×3): 7 [IU] via SUBCUTANEOUS
  Administered 2016-05-20: 11 [IU] via SUBCUTANEOUS
  Administered 2016-05-20 – 2016-05-21 (×6): 7 [IU] via SUBCUTANEOUS
  Administered 2016-05-21: 4 [IU] via SUBCUTANEOUS
  Administered 2016-05-21: 7 [IU] via SUBCUTANEOUS

## 2016-05-19 MED ORDER — KETAMINE HCL 10 MG/ML IJ SOLN
INTRAMUSCULAR | Status: DC | PRN
  Administered 2016-05-19 (×4): 10 mg via INTRAVENOUS
  Administered 2016-05-19: 50 mg via INTRAVENOUS

## 2016-05-19 MED ORDER — MORPHINE SULFATE (PF) 2 MG/ML IV SOLN
2.0000 mg | INTRAVENOUS | Status: DC | PRN
  Administered 2016-05-19 – 2016-05-20 (×6): 2 mg via INTRAVENOUS
  Filled 2016-05-19 (×2): qty 1
  Filled 2016-05-19: qty 2
  Filled 2016-05-19 (×3): qty 1

## 2016-05-19 MED ORDER — SUGAMMADEX SODIUM 200 MG/2ML IV SOLN
INTRAVENOUS | Status: AC
  Filled 2016-05-19: qty 2

## 2016-05-19 MED ORDER — DEXAMETHASONE SODIUM PHOSPHATE 10 MG/ML IJ SOLN
INTRAMUSCULAR | Status: DC | PRN
  Administered 2016-05-19: 4 mg via INTRAVENOUS

## 2016-05-19 MED ORDER — HEPARIN SODIUM (PORCINE) 5000 UNIT/ML IJ SOLN
5000.0000 [IU] | Freq: Three times a day (TID) | INTRAMUSCULAR | Status: DC
  Administered 2016-05-19 – 2016-05-21 (×5): 5000 [IU] via SUBCUTANEOUS
  Filled 2016-05-19 (×5): qty 1

## 2016-05-19 MED ORDER — SUFENTANIL CITRATE 50 MCG/ML IV SOLN
INTRAVENOUS | Status: AC
  Filled 2016-05-19: qty 1

## 2016-05-19 MED ORDER — ROCURONIUM BROMIDE 50 MG/5ML IV SOSY
PREFILLED_SYRINGE | INTRAVENOUS | Status: AC
  Filled 2016-05-19: qty 5

## 2016-05-19 MED ORDER — CEFOTETAN DISODIUM-DEXTROSE 2-2.08 GM-% IV SOLR
INTRAVENOUS | Status: AC
  Filled 2016-05-19: qty 50

## 2016-05-19 MED ORDER — LACTATED RINGERS IV SOLN: INTRAVENOUS | Status: DC

## 2016-05-19 MED ORDER — KCL IN DEXTROSE-NACL 20-5-0.45 MEQ/L-%-% IV SOLN
INTRAVENOUS | Status: DC
  Administered 2016-05-19: 19:00:00 via INTRAVENOUS
  Administered 2016-05-20: 100 mL/h via INTRAVENOUS
  Administered 2016-05-20: 15:00:00 via INTRAVENOUS
  Administered 2016-05-21: 1000 mL via INTRAVENOUS
  Filled 2016-05-19 (×5): qty 1000

## 2016-05-19 MED ORDER — DEXAMETHASONE SODIUM PHOSPHATE 10 MG/ML IJ SOLN
INTRAMUSCULAR | Status: AC
  Filled 2016-05-19: qty 1

## 2016-05-19 MED ORDER — LIDOCAINE 2% (20 MG/ML) 5 ML SYRINGE
INTRAMUSCULAR | Status: AC
  Filled 2016-05-19: qty 5

## 2016-05-19 MED ORDER — ONDANSETRON HCL 4 MG/2ML IJ SOLN
4.0000 mg | INTRAMUSCULAR | Status: DC | PRN
  Administered 2016-05-19 – 2016-05-20 (×3): 4 mg via INTRAVENOUS
  Filled 2016-05-19 (×3): qty 2

## 2016-05-19 MED ORDER — SODIUM CHLORIDE 0.9 % IJ SOLN
INTRAMUSCULAR | Status: AC
  Filled 2016-05-19: qty 10

## 2016-05-19 MED ORDER — SUGAMMADEX SODIUM 200 MG/2ML IV SOLN
INTRAVENOUS | Status: DC | PRN
  Administered 2016-05-19: 200 mg via INTRAVENOUS

## 2016-05-19 MED ORDER — ALBUMIN HUMAN 5 % IV SOLN
INTRAVENOUS | Status: AC
  Filled 2016-05-19: qty 250

## 2016-05-19 MED ORDER — ONDANSETRON HCL 4 MG/2ML IJ SOLN
INTRAMUSCULAR | Status: DC | PRN
  Administered 2016-05-19: 4 mg via INTRAVENOUS

## 2016-05-19 MED ORDER — SUFENTANIL CITRATE 50 MCG/ML IV SOLN
INTRAVENOUS | Status: DC | PRN
  Administered 2016-05-19 (×2): 10 ug via INTRAVENOUS
  Administered 2016-05-19: 20 ug via INTRAVENOUS

## 2016-05-19 MED ORDER — ROCURONIUM BROMIDE 50 MG/5ML IV SOSY
PREFILLED_SYRINGE | INTRAVENOUS | Status: AC
Start: 2016-05-19 — End: 2016-05-19
  Filled 2016-05-19: qty 5

## 2016-05-19 MED ORDER — ONDANSETRON HCL 4 MG/2ML IJ SOLN
INTRAMUSCULAR | Status: AC
  Filled 2016-05-19: qty 2

## 2016-05-19 MED ORDER — MIDAZOLAM HCL 2 MG/2ML IJ SOLN
INTRAMUSCULAR | Status: DC | PRN
  Administered 2016-05-19: 2 mg via INTRAVENOUS

## 2016-05-19 MED ORDER — INSULIN ASPART 100 UNIT/ML ~~LOC~~ SOLN
6.0000 [IU] | Freq: Once | SUBCUTANEOUS | Status: AC
  Administered 2016-05-19: 6 [IU] via INTRAVENOUS

## 2016-05-19 MED ORDER — LIDOCAINE 2% (20 MG/ML) 5 ML SYRINGE
INTRAMUSCULAR | Status: DC | PRN
  Administered 2016-05-19: 100 mg via INTRAVENOUS

## 2016-05-19 MED ORDER — SCOPOLAMINE 1 MG/3DAYS TD PT72
MEDICATED_PATCH | TRANSDERMAL | Status: DC | PRN
  Administered 2016-05-19: 1 via TRANSDERMAL

## 2016-05-19 MED ORDER — ALBUMIN HUMAN 25 % IV SOLN
INTRAVENOUS | Status: DC | PRN
  Administered 2016-05-19: 09:00:00 via INTRAVENOUS

## 2016-05-19 MED ORDER — ACETAMINOPHEN 160 MG/5ML PO SOLN
650.0000 mg | ORAL | Status: DC | PRN
  Administered 2016-05-21: 650 mg via ORAL
  Filled 2016-05-19: qty 20.3

## 2016-05-19 MED ORDER — ACETAMINOPHEN 10 MG/ML IV SOLN
INTRAVENOUS | Status: DC | PRN
  Administered 2016-05-19: 1000 mg via INTRAVENOUS

## 2016-05-19 MED ORDER — SCOPOLAMINE 1 MG/3DAYS TD PT72
MEDICATED_PATCH | TRANSDERMAL | Status: AC
  Filled 2016-05-19: qty 1

## 2016-05-19 MED ORDER — TISSEEL VH 10 ML EX KIT
PACK | CUTANEOUS | Status: AC
  Filled 2016-05-19: qty 1

## 2016-05-19 MED ORDER — SUCCINYLCHOLINE CHLORIDE 200 MG/10ML IV SOSY
PREFILLED_SYRINGE | INTRAVENOUS | Status: DC | PRN
  Administered 2016-05-19: 160 mg via INTRAVENOUS

## 2016-05-19 MED ORDER — HEPARIN SODIUM (PORCINE) 5000 UNIT/ML IJ SOLN
5000.0000 [IU] | INTRAMUSCULAR | Status: AC
  Administered 2016-05-19: 5000 [IU] via SUBCUTANEOUS
  Filled 2016-05-19: qty 1

## 2016-05-19 MED ORDER — SODIUM CHLORIDE 0.9 % IJ SOLN
INTRAMUSCULAR | Status: DC | PRN
  Administered 2016-05-19: 10 mL via INTRAVENOUS

## 2016-05-19 MED ORDER — HYDROMORPHONE HCL 2 MG/ML IJ SOLN
INTRAMUSCULAR | Status: AC
  Filled 2016-05-19: qty 1

## 2016-05-19 MED ORDER — PROPOFOL 10 MG/ML IV BOLUS
INTRAVENOUS | Status: DC | PRN
  Administered 2016-05-19: 180 mg via INTRAVENOUS

## 2016-05-19 MED ORDER — CEFOTETAN DISODIUM-DEXTROSE 2-2.08 GM-% IV SOLR
2.0000 g | INTRAVENOUS | Status: AC
  Administered 2016-05-19: 2 g via INTRAVENOUS

## 2016-05-19 MED ORDER — PROPOFOL 10 MG/ML IV BOLUS
INTRAVENOUS | Status: AC
  Filled 2016-05-19: qty 20

## 2016-05-19 MED ORDER — PREMIER PROTEIN SHAKE
2.0000 [oz_av] | ORAL | Status: DC
  Administered 2016-05-21 (×5): 2 [oz_av] via ORAL

## 2016-05-19 MED ORDER — HYDROMORPHONE HCL 1 MG/ML IJ SOLN
0.2500 mg | INTRAMUSCULAR | Status: DC | PRN
  Administered 2016-05-19: 0.5 mg via INTRAVENOUS

## 2016-05-19 MED ORDER — PROMETHAZINE HCL 25 MG/ML IJ SOLN
INTRAMUSCULAR | Status: AC
  Filled 2016-05-19: qty 1

## 2016-05-19 MED ORDER — ACETAMINOPHEN 10 MG/ML IV SOLN
INTRAVENOUS | Status: AC
  Filled 2016-05-19: qty 100

## 2016-05-19 MED ORDER — BUPIVACAINE LIPOSOME 1.3 % IJ SUSP
20.0000 mL | Freq: Once | INTRAMUSCULAR | Status: AC
  Administered 2016-05-19: 20 mL
  Filled 2016-05-19: qty 20

## 2016-05-19 SURGICAL SUPPLY — 72 items
APPLICATOR COTTON TIP 6IN STRL (MISCELLANEOUS) IMPLANT
APPLIER CLIP ROT 10 11.4 M/L (STAPLE)
APPLIER CLIP ROT 13.4 12 LRG (CLIP)
BENZOIN TINCTURE PRP APPL 2/3 (GAUZE/BANDAGES/DRESSINGS) IMPLANT
BLADE SURG 15 STRL LF DISP TIS (BLADE) ×1 IMPLANT
BLADE SURG 15 STRL SS (BLADE) ×1
CABLE HIGH FREQUENCY MONO STRZ (ELECTRODE) ×2 IMPLANT
CLIP APPLIE ROT 10 11.4 M/L (STAPLE) IMPLANT
CLIP APPLIE ROT 13.4 12 LRG (CLIP) IMPLANT
CLIP SUT LAPRA TY ABSORB (SUTURE) ×2 IMPLANT
COVER SURGICAL LIGHT HANDLE (MISCELLANEOUS) ×2 IMPLANT
DEVICE SUT QUICK LOAD TK 5 (STAPLE) ×4 IMPLANT
DEVICE SUT TI-KNOT TK 5X26 (MISCELLANEOUS) ×2 IMPLANT
DEVICE SUTURE ENDOST 10MM (ENDOMECHANICALS) ×2 IMPLANT
DISSECTOR BLUNT TIP ENDO 5MM (MISCELLANEOUS) ×2 IMPLANT
DRAIN PENROSE 18X1/4 LTX STRL (WOUND CARE) ×2 IMPLANT
ELECT L-HOOK LAP 45CM DISP (ELECTROSURGICAL) ×2
ELECT PENCIL ROCKER SW 15FT (MISCELLANEOUS) ×2 IMPLANT
ELECTRODE L-HOOK LAP 45CM DISP (ELECTROSURGICAL) ×1 IMPLANT
GAUZE SPONGE 4X4 12PLY STRL (GAUZE/BANDAGES/DRESSINGS) IMPLANT
GAUZE SPONGE 4X4 16PLY XRAY LF (GAUZE/BANDAGES/DRESSINGS) ×2 IMPLANT
GLOVE BIOGEL M 8.0 STRL (GLOVE) ×2 IMPLANT
GOWN STRL REUS W/TWL XL LVL3 (GOWN DISPOSABLE) ×8 IMPLANT
HANDLE STAPLE EGIA 4 XL (STAPLE) ×2 IMPLANT
HOVERMATT SINGLE USE (MISCELLANEOUS) ×2 IMPLANT
IRRIG SUCT STRYKERFLOW 2 WTIP (MISCELLANEOUS) ×2
IRRIGATION SUCT STRKRFLW 2 WTP (MISCELLANEOUS) ×1 IMPLANT
KIT BASIN OR (CUSTOM PROCEDURE TRAY) ×2 IMPLANT
KIT GASTRIC LAVAGE 34FR ADT (SET/KITS/TRAYS/PACK) ×2 IMPLANT
MARKER SKIN DUAL TIP RULER LAB (MISCELLANEOUS) ×2 IMPLANT
NEEDLE SPNL 22GX3.5 QUINCKE BK (NEEDLE) ×2 IMPLANT
PACK CARDIOVASCULAR III (CUSTOM PROCEDURE TRAY) ×2 IMPLANT
PORT ACCESS TROCAR AIRSEAL 12 (TROCAR) ×1 IMPLANT
PORT ACCESS TROCAR AIRSEAL 5M (TROCAR) ×1
RELOAD EGIA 45 MED/THCK PURPLE (STAPLE) IMPLANT
RELOAD EGIA 45 TAN VASC (STAPLE) IMPLANT
RELOAD EGIA 60 MED/THCK PURPLE (STAPLE) IMPLANT
RELOAD EGIA 60 TAN VASC (STAPLE) IMPLANT
RELOAD ENDO STITCH 2.0 (ENDOMECHANICALS) ×9
RELOAD TRI 45 ART MED THCK PUR (STAPLE) ×2 IMPLANT
RELOAD TRI 60 ART MED THCK PUR (STAPLE) ×6 IMPLANT
SCISSORS LAP 5X45 EPIX DISP (ENDOMECHANICALS) ×2 IMPLANT
SCRUB TECHNI CARE 4 OZ NO DYE (MISCELLANEOUS) ×2 IMPLANT
SEALANT SURGICAL APPL DUAL CAN (MISCELLANEOUS) ×2 IMPLANT
SET TRI-LUMEN FLTR TB AIRSEAL (TUBING) ×2 IMPLANT
SHEARS HARMONIC ACE PLUS 45CM (MISCELLANEOUS) ×2 IMPLANT
SLEEVE ADV FIXATION 12X100MM (TROCAR) ×4 IMPLANT
SLEEVE ADV FIXATION 5X100MM (TROCAR) IMPLANT
SOLUTION ANTI FOG 6CC (MISCELLANEOUS) ×2 IMPLANT
STAPLER VISISTAT 35W (STAPLE) ×2 IMPLANT
STRIP CLOSURE SKIN 1/2X4 (GAUZE/BANDAGES/DRESSINGS) IMPLANT
SUT RELOAD ENDO STITCH 2 48X1 (ENDOMECHANICALS) ×5
SUT RELOAD ENDO STITCH 2.0 (ENDOMECHANICALS) ×4
SUT SURGIDAC NAB ES-9 0 48 120 (SUTURE) ×4 IMPLANT
SUT VIC AB 2-0 SH 27 (SUTURE) ×1
SUT VIC AB 2-0 SH 27X BRD (SUTURE) ×1 IMPLANT
SUT VIC AB 4-0 SH 18 (SUTURE) ×2 IMPLANT
SUTURE RELOAD END STTCH 2 48X1 (ENDOMECHANICALS) ×5 IMPLANT
SUTURE RELOAD ENDO STITCH 2.0 (ENDOMECHANICALS) ×4 IMPLANT
SYR 10ML ECCENTRIC (SYRINGE) ×2 IMPLANT
SYR 20CC LL (SYRINGE) ×4 IMPLANT
SYR 50ML LL SCALE MARK (SYRINGE) ×2 IMPLANT
TOWEL OR 17X26 10 PK STRL BLUE (TOWEL DISPOSABLE) ×4 IMPLANT
TOWEL OR NON WOVEN STRL DISP B (DISPOSABLE) ×2 IMPLANT
TRAY FOLEY W/METER SILVER 16FR (SET/KITS/TRAYS/PACK) ×2 IMPLANT
TROCAR ADV FIXATION 12X100MM (TROCAR) ×2 IMPLANT
TROCAR ADV FIXATION 5X100MM (TROCAR) ×2 IMPLANT
TROCAR BLADELESS OPT 5 100 (ENDOMECHANICALS) ×2 IMPLANT
TROCAR XCEL 12X100 BLDLESS (ENDOMECHANICALS) ×2 IMPLANT
TUBING CONNECTING 10 (TUBING) ×2 IMPLANT
TUBING ENDO SMARTCAP PENTAX (MISCELLANEOUS) ×2 IMPLANT
TUBING INSUF HEATED (TUBING) ×2 IMPLANT

## 2016-05-19 NOTE — Progress Notes (Signed)
Inpatient Diabetes Program Recommendations  AACE/ADA: New Consensus Statement on Inpatient Glycemic Control (2015)  Target Ranges:  Prepandial:   less than 140 mg/dL      Peak postprandial:   less than 180 mg/dL (1-2 hours)      Critically ill patients:  140 - 180 mg/dL   Lab Results  Component Value Date   GLUCAP 248 (H) 05/19/2016   HGBA1C 11.9 (H) 05/14/2016   Results for Rachel Stewart, Rachel Stewart (MRN [id number]) as of 05/19/2016 14:22  Ref. Range 05/19/2016 08:07 05/19/2016 09:05 05/19/2016 10:07 05/19/2016 11:14 05/19/2016 13:09  Glucose-Capillary Latest Ref Range: 65 - 99 mg/dL 409167 (H) 811271 (H) 914266 (H) 250 (H) 248 (H)   Review of Glycemic Control  Diabetes history:       DM2, Obesity, BUN and Creatinine WNL Outpatient Diabetes medications:       Amaryl 4 mg BID, Jentadueto XR 2.10-998 mg daily Current orders for Inpatient glycemic control:       Novolog 0-20 units Q4H correction  Inpatient Diabetes Program Recommendations:      Please consider basal insulin of Lantus 10 units now (99 Kg x 0.1 units/Kg).      A HGB A1C of 11.9% = an average CBG of 298 mg/dL, indicating DM has been poorly controlled for 2-3 months.  Thank you,  Kristine LineaKaren Betsie Peckman, RN, BSN Diabetes Coordinator Inpatient Diabetes Program [phone] (Team Pager)

## 2016-05-19 NOTE — Interval H&P Note (Signed)
History and Physical Interval Note:  05/19/2016 7:20 AM  Tally JoeBrenzle N Weiand  has presented today for surgery, with the diagnosis of Morbid Obesity, GERD Diabetes, Hiatal hernia  The various methods of treatment have been discussed with the patient and family. After consideration of risks, benefits and other options for treatment, the patient has consented to  Procedure(s): LAPAROSCOPIC ROUX-EN-Y GASTRIC BYPASS WITH HIATAL HERNIA REPAIR, UPPER ENDO (N/A) as a surgical intervention .  The patient's history has been reviewed, patient examined, no change in status, stable for surgery.  I have reviewed the patient's chart and labs.  Questions were answered to the patient's satisfaction.     Nana Hoselton B

## 2016-05-19 NOTE — Progress Notes (Signed)
Pt refuse NIV for the night. Stated that she wishes not to wear a CPAP to sleep with.

## 2016-05-19 NOTE — Anesthesia Postprocedure Evaluation (Signed)
Anesthesia Post Note  Patient: Rachel Stewart  Procedure(s) Performed: Procedure(s) (LRB): LAPAROSCOPIC ROUX-EN-Y GASTRIC BYPASS WITH HIATAL HERNIA REPAIR, UPPER ENDO (N/A)  Patient location during evaluation: PACU Anesthesia Type: General Level of consciousness: awake and alert Pain management: pain level controlled Vital Signs Assessment: post-procedure vital signs reviewed and stable Respiratory status: spontaneous breathing, nonlabored ventilation, respiratory function stable and patient connected to nasal cannula oxygen Cardiovascular status: blood pressure returned to baseline and stable Postop Assessment: no signs of nausea or vomiting Anesthetic complications: no    Last Vitals:  Vitals:   05/19/16 1315 05/19/16 1330  BP: (!) 147/100 132/86  Pulse: (!) 107 (!) 107  Resp: 15 14  Temp: 36.9 C 36.7 C    Last Pain:  Vitals:   05/19/16 1315  TempSrc:   PainSc: Ardean LarsenAsleep                 Dyllen Menning E

## 2016-05-19 NOTE — Op Note (Signed)
Name:  Rachel Stewart MRN: [id number] Date of Surgery: 05/19/2016  Preop Diagnosis:  Morbid Obesity, S/P RYGB  Postop Diagnosis:  Morbid Obesity, S/P RYGB (Weight - 223, BMI - 38.3)  Procedure:  Upper endoscopy  (Intraoperative)  Surgeon:  Ovidio Kinavid Donnesha Karg, M.D.  Anesthesia:  GET  Indications for procedure: Rachel Stewart is a 45 y.o. female whose primary care physician is Laurena SlimmerLARK,PRESTON S, MD and has completed a Roux-en-Y gastric bypass today by Dr. Daphine DeutscherMartin.  I am doing an intraoperative upper endoscopy to evaluate the gastric pouch and the gastro-jejunal anastomosis.  Operative Note: The patient is under general anesthesia.  Dr. Daphine DeutscherMartin is laparoscoping the patient while I do an upper endoscopy to evaluate the stomach pouch and gastrojejunal anastomosis.  With the patient intubated, I passed the Olympus endoscope without difficulty down the esophagus.  The esophago-gastric junction was at 32 cm.  The gastro-jejunal anastomosis was at 39 cm.  The mucosa of the stomach looked viable and the staple line was intact without bleeding.  The gastro-jejunal anastomosis looked okay.  While I insufflated the stomach pouch with air, Dr. Daphine DeutscherMartin clamped off the efferent limb of the jejunum.  He then flooded the upper abdomen with saline to put the gastric pouch and gastro-jejunal anastomosis under saline.  There was no bubbling or evidence of a leak.    The scope was then withdrawn.  The esophagus was unremarkable and the patient tolerated the endoscopy without difficulty.  Ovidio Kinavid Zarian Colpitts, MD, [facility]FACS [facility] Pager: [phone] Office phone:  [phone]

## 2016-05-19 NOTE — Op Note (Signed)
Surgeon: Pollyann SavoyMatt B. Daphine DeutscherMartin, MD, FACS Asst:  Ovidio Kinavid Newman, MD,FACS Anesthesia: General endotracheal Drains: None  Procedure: Laparoscopic Roux en Y gastric bypass with 40 cm BP limb and 100 cm Roux limb, antecolic, antegastric, candy cane to the left.  Closure of Peterson's defect. Upper endoscopy.   Description of Procedure:  [facility] and given general anesthesia.  The abdomen was prepped with TEchnicare and draped sterilely.  A time out was performed.    The operation began by identifying the ligament of Treitz. I measured 40 cm downstream and divided the bowel with a 6 cm Covidian stapler.  I sutured a Penrose drain along the Roux limb end.  I measured a 1 meter (100 cm) Roux limb and then placed the distal bowels to the BP limb side by side and performed a stapled jejunojejunostomy. The common defect was closed from either end with 4-0 Vicryl using the Endo Stitch. The mesenteric defect was closed with a running 2-0 silk using the Endo Stitch. Tisseel was applied to the suture line.  The omentum was divided with the harmonic scalpel.  The Nathanson retractor was inserted in the left lateral segment of liver was retracted. The foregut dissection ensued.  About 5 cm down on the lessor curvature, we dissected in to enter the lessor sac.  Sequential application of the 6 cm purple load Covidien stapler the first two firings without TRS and the rest with TRS.  A nice pouch was present.  The Roux limb was then brought up with the candycane pointed left and a back row of sutures of 2-0 Vicryl were placed.  I had to place two sutures to complete the back row.   I opened along the right side of each structure and inserted the 4.5 cm stapler to create the gastrojejunostomy. The common defect was closed from either end with 2-0 Vicryl and a second row was placed anterior to that the Ewald tube acting as a stent across the anastomosis. The Penrose drain was removed. Peterson's defect was  closed with 2-0 silk.   Endoscopy was performed by Dr. Ezzard StandingNewman and no bleeding or bubbles were seen.  The incisions were injected with Expare land were closed with 4-0 Monocryl and Dermabond.  The patient was taken to the recovery room in satisfactory condition.  Matt B. Daphine DeutscherMartin, MD, FACS

## 2016-05-19 NOTE — Anesthesia Procedure Notes (Signed)
Procedure Name: Intubation Date/Time: 05/19/2016 7:30 AM Performed by: Leroy LibmanEARDON, Rachel Stewart Patient Re-evaluated:Patient Re-evaluated prior to inductionOxygen Delivery Method: Circle system utilized Preoxygenation: Pre-oxygenation with 100% oxygen Intubation Type: IV induction Ventilation: Oral airway inserted - appropriate to patient size and Mask ventilation without difficulty Laryngoscope Size: Miller and 2 Grade View: Grade I Tube type: Oral Tube size: 7.5 mm Number of attempts: 1 Airway Equipment and Method: Stylet Placement Confirmation: ETT inserted through vocal cords under direct vision,  positive ETCO2 and breath sounds checked- equal and bilateral Secured at: 21 cm Tube secured with: Tape Dental Injury: Teeth and Oropharynx as per pre-operative assessment

## 2016-05-19 NOTE — Transfer of Care (Signed)
Immediate Anesthesia Transfer of Care Note  Patient: Rachel Stewart  Procedure(s) Performed: Procedure(s): LAPAROSCOPIC ROUX-EN-Y GASTRIC BYPASS WITH HIATAL HERNIA REPAIR, UPPER ENDO (N/A)  Patient Location: PACU  Anesthesia Type:General  Level of Consciousness: awake and alert   Airway & Oxygen Therapy: Patient Spontanous Breathing and Patient connected to face mask oxygen  Post-op Assessment: Report given to RN and Post -op Vital signs reviewed and stable  Post vital signs: Reviewed and stable  Last Vitals:  Vitals:   05/19/16 0533  BP: (!) 142/98  Pulse: 89  Resp: 18  Temp: 36.6 C    Last Pain:  Vitals:   05/19/16 0533  TempSrc: Oral  PainSc:       Patients Stated Pain Goal: 3 (05/19/16 0531)  Complications: No apparent anesthesia complications

## 2016-05-20 LAB — GLUCOSE, CAPILLARY
GLUCOSE-CAPILLARY: 161 mg/dL — AB (ref 65–99)
GLUCOSE-CAPILLARY: 205 mg/dL — AB (ref 65–99)
GLUCOSE-CAPILLARY: 245 mg/dL — AB (ref 65–99)
GLUCOSE-CAPILLARY: 278 mg/dL — AB (ref 65–99)
Glucose-Capillary: 210 mg/dL — ABNORMAL HIGH (ref 65–99)
Glucose-Capillary: 237 mg/dL — ABNORMAL HIGH (ref 65–99)

## 2016-05-20 LAB — CBC WITH DIFFERENTIAL/PLATELET
Basophils Absolute: 0 10*3/uL (ref 0.0–0.1)
Basophils Relative: 0 %
EOS PCT: 0 %
Eosinophils Absolute: 0 10*3/uL (ref 0.0–0.7)
HEMATOCRIT: 35.5 % — AB (ref 36.0–46.0)
Hemoglobin: 11.9 g/dL — ABNORMAL LOW (ref 12.0–15.0)
LYMPHS ABS: 2.3 10*3/uL (ref 0.7–4.0)
LYMPHS PCT: 15 %
MCH: 27 pg (ref 26.0–34.0)
MCHC: 33.5 g/dL (ref 30.0–36.0)
MCV: 80.7 fL (ref 78.0–100.0)
MONO ABS: 1.2 10*3/uL — AB (ref 0.1–1.0)
Monocytes Relative: 8 %
NEUTROS ABS: 11.9 10*3/uL — AB (ref 1.7–7.7)
Neutrophils Relative %: 77 %
Platelets: 364 10*3/uL (ref 150–400)
RBC: 4.4 MIL/uL (ref 3.87–5.11)
RDW: 14.3 % (ref 11.5–15.5)
WBC: 15.4 10*3/uL — AB (ref 4.0–10.5)

## 2016-05-20 MED ORDER — LIP MEDEX EX OINT
TOPICAL_OINTMENT | CUTANEOUS | Status: AC
  Filled 2016-05-20: qty 7

## 2016-05-20 NOTE — Progress Notes (Signed)
Inpatient Diabetes Program Recommendations  AACE/ADA: New Consensus Statement on Inpatient Glycemic Control (2015)  Target Ranges:  Prepandial:   less than 140 mg/dL      Peak postprandial:   less than 180 mg/dL (1-2 hours)      Critically ill patients:  140 - 180 mg/dL   Lab Results  Component Value Date   GLUCAP 245 (H) 05/20/2016   HGBA1C 11.9 (H) 05/14/2016    Review of Glycemic Control  Results for Rachel Stewart, Rachel Stewart (MRN [id number]) as of 05/20/2016 14:50  Ref. Range 05/19/2016 19:38 05/19/2016 23:25 05/20/2016 03:34 05/20/2016 07:49 05/20/2016 12:04  Glucose-Capillary Latest Ref Range: 65 - 99 mg/dL 657211 (H) 846217 (H) 962210 (H) 237 (H) 245 (H)   On Novolog resistant Q4H.  Inpatient Diabetes Program Recommendations:    Add Lantus 10 units QHS  Will continue to follow.  Thank you. Ailene Ardshonda Jatavius Ellenwood, RD, LDN, CDE Inpatient Diabetes Coordinator [phone]

## 2016-05-20 NOTE — Plan of Care (Signed)
Problem: Food- and Nutrition-Related Knowledge Deficit (NB-1.1) Goal: Nutrition education Formal process to instruct or train a patient/client in a skill or to impart knowledge to help patients/clients voluntarily manage or modify food choices and eating behavior to maintain or improve health. Outcome: Completed/Met Date Met: 05/20/16 Nutrition Education Note  Received consult for diet education per DROP protocol.   Discussed 2 week post op diet with pt. Emphasized that liquids must be non carbonated, non caffeinated, and sugar free. Fluid goals discussed. Reviewed progression of diet to include soft proteins at 7-10 days post-op. Pt to follow up with outpatient bariatric RD for further diet progression after 2 weeks. Multivitamins and minerals also reviewed. Teach back method used, pt expressed understanding, expect good compliance.   Diet: First 2 Weeks  You will see the dietitian about two (2) weeks after your surgery. The dietitian will increase the types of foods you can eat if you are handling liquids well:  If you have severe vomiting or nausea and cannot handle clear liquids lasting longer than 1 day, call your surgeon  Protein Shake  Drink at least 2 ounces of shake 5-6 times per day  Each serving of protein shakes (usually 8 - 12 ounces) should have a minimum of:  15 grams of protein  And no more than 5 grams of carbohydrate  Goal for protein each day:  Men = 80 grams per day  Women = 60 grams per day  Protein powder may be added to fluids such as non-fat milk or Lactaid milk or Soy milk (limit to 35 grams added protein powder per serving)   Hydration  Slowly increase the amount of water and other clear liquids as tolerated (See Acceptable Fluids)  Slowly increase the amount of protein shake as tolerated  Sip fluids slowly and throughout the day  May use sugar substitutes in small amounts (no more than 6 - 8 packets per day; i.e. Splenda)   Fluid Goal  The first goal is to  drink at least 8 ounces of protein shake/drink per day (or as directed by the nutritionist); some examples of protein shakes are Syntrax Nectar, Adkins Advantage, EAS Edge HP, and Unjury. See handout from pre-op Bariatric Education Class:  Slowly increase the amount of protein shake you drink as tolerated  You may find it easier to slowly sip shakes throughout the day  It is important to get your proteins in first  Your fluid goal is to drink 64 - 100 ounces of fluid daily  It may take a few weeks to build up to this  32 oz (or more) should be clear liquids  And  32 oz (or more) should be full liquids (see below for examples)  Liquids should not contain sugar, caffeine, or carbonation   Clear Liquids:  Water or Sugar-free flavored water (i.e. Fruit H2O, Propel)  Decaffeinated coffee or tea (sugar-free)  Crystal Lite, Wyler's Lite, Minute Maid Lite  Sugar-free Jell-O  Bouillon or broth  Sugar-free Popsicle: *Less than 20 calories each; Limit 1 per day   Full Liquids:  Protein Shakes/Drinks + 2 choices per day of other full liquids  Full liquids must be:  No More Than 12 grams of Carbs per serving  No More Than 3 grams of Fat per serving  Strained low-fat cream soup  Non-Fat milk  Fat-free Lactaid Milk  Sugar-free yogurt (Dannon Lite & Fit, Greek yogurt)     Amaree Loisel, MS, RD, LDN Pager: [phone] After Hours Pager: [phone]    

## 2016-05-20 NOTE — Discharge Instructions (Signed)

## 2016-05-20 NOTE — Progress Notes (Signed)
Patient tolerated a total of 12 oz of water ,order is to advance diet to POD2 when tolerates POD1 diet. Patient just given 2 oz of protein shake to start with we will continue to monitor and assess the patient.

## 2016-05-21 LAB — GLUCOSE, CAPILLARY
GLUCOSE-CAPILLARY: 234 mg/dL — AB (ref 65–99)
Glucose-Capillary: 209 mg/dL — ABNORMAL HIGH (ref 65–99)
Glucose-Capillary: 211 mg/dL — ABNORMAL HIGH (ref 65–99)

## 2016-05-21 LAB — CBC WITH DIFFERENTIAL/PLATELET
Basophils Absolute: 0 10*3/uL (ref 0.0–0.1)
Basophils Relative: 0 %
EOS ABS: 0 10*3/uL (ref 0.0–0.7)
EOS PCT: 0 %
HCT: 33.6 % — ABNORMAL LOW (ref 36.0–46.0)
Hemoglobin: 10.9 g/dL — ABNORMAL LOW (ref 12.0–15.0)
LYMPHS ABS: 2.2 10*3/uL (ref 0.7–4.0)
Lymphocytes Relative: 21 %
MCH: 27.2 pg (ref 26.0–34.0)
MCHC: 32.4 g/dL (ref 30.0–36.0)
MCV: 83.8 fL (ref 78.0–100.0)
MONO ABS: 0.8 10*3/uL (ref 0.1–1.0)
MONOS PCT: 8 %
Neutro Abs: 7.5 10*3/uL (ref 1.7–7.7)
Neutrophils Relative %: 71 %
PLATELETS: 346 10*3/uL (ref 150–400)
RBC: 4.01 MIL/uL (ref 3.87–5.11)
RDW: 14.7 % (ref 11.5–15.5)
WBC: 10.6 10*3/uL — ABNORMAL HIGH (ref 4.0–10.5)

## 2016-05-21 NOTE — Progress Notes (Signed)
Pt is alert and oriented, has tolerated 12oz of water and 5oz of protein shake.  Pt is ambulating in hallway and pain is controlled.

## 2016-05-21 NOTE — Progress Notes (Signed)
Pt was given discharge instructions all questions were answered. Patient was taken in wheelchair to main entrance via NT. Harace Mccluney R McClean

## 2016-05-21 NOTE — Discharge Summary (Signed)
Physician Discharge Summary  Patient ID: Rachel Stewart MRN: [id number] DOB/AGE: 47/10/1970 47 y.o.  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  Begun on liquids on Tuesday and advanced.  Ready for discharge on Wednesday  Consults: none  Significant Diagnostic Studies: none    Discharge Exam: Blood pressure 104/69, pulse 86, temperature 98.5 F (36.9 C), temperature source Oral, resp. rate 18, height 5\' 4"  (1.626 m), weight 99 kg (218 lb 3.2 oz), last menstrual period 05/06/2016, SpO2 95 %. Incisions ok  Disposition: Final discharge disposition not confirmed  Discharge Instructions    Ambulate hourly while awake    Complete by:  As directed    Call MD for:  difficulty breathing, headache or visual disturbances    Complete by:  As directed    Call MD for:  persistant dizziness or light-headedness    Complete by:  As directed    Call MD for:  persistant nausea and vomiting    Complete by:  As directed    Call MD for:  redness, tenderness, or signs of infection (pain, swelling, redness, odor or green/yellow discharge around incision site)    Complete by:  As directed    Call MD for:  severe uncontrolled pain    Complete by:  As directed    Call MD for:  temperature >101 F    Complete by:  As directed    Diet bariatric full liquid    Complete by:  As directed    Incentive spirometry    Complete by:  As directed    Perform hourly while awake       Medication List    TAKE these medications   albuterol 108 (90 Base) MCG/ACT inhaler Commonly known as:  PROVENTIL HFA;VENTOLIN HFA Inhale 2 puffs into the lungs every 4 (four) hours as needed for wheezing or shortness of breath.    amLODipine 10 MG tablet Commonly known as:  NORVASC Take 10 mg by mouth daily.   amoxicillin 500 MG capsule Commonly known as:  AMOXIL Take 1,000 mg by mouth every evening. Started 05-06-16 for sinus infection   glimepiride 4 MG tablet Commonly known as:  AMARYL Take 4 mg by mouth 2 (two) times daily.   JENTADUETO XR 2.10-998 MG Tb24 Generic drug:  Linagliptin-Metformin HCl ER Take 2 tablets by mouth daily.   loratadine 10 MG tablet Commonly known as:  CLARITIN Take 10 mg by mouth daily as needed for allergies.   metoprolol succinate 50 MG 24 hr tablet Commonly known as:  TOPROL-XL Take 50 mg by mouth daily.   omeprazole 40 MG capsule Commonly known as:  PRILOSEC Take 40 mg by mouth daily.   pseudoephedrine-acetaminophen 30-500 MG Tabs tablet Commonly known as:  TYLENOL SINUS Take 2 tablets by mouth every 4 (four) hours as needed (cold symptoms).      Follow-up Information    Rachel Stewart,Rachel Snellgrove B, MD Follow up.   Specialty:  General Surgery Contact information: [address] [place] [address] [phone]           Signed: Valarie Stewart,Rachel Stewart 05/21/2016, 10:22 AM

## 2016-05-21 NOTE — Progress Notes (Signed)
Patient alert and oriented, pain is controlled. Patient is tolerating fluids, advanced to protein shake today, patient is tolerating well. Reviewed Gastric Bypass discharge instructions with patient and patient is able to articulate understanding. Provided information on BELT program, Support Group and [facility] outpatient pharmacy. All questions answered, will continue to monitor.    

## 2016-05-25 ENCOUNTER — Emergency Department (HOSPITAL_COMMUNITY)
Admission: EM | Admit: 2016-05-25 | Discharge: 2016-05-25 | Disposition: A | Discharge status: Home / Self Care | Payer: BLUE CROSS/BLUE SHIELD | Attending: Emergency Medicine | Admitting: Emergency Medicine

## 2016-05-25 ENCOUNTER — Emergency Department (HOSPITAL_COMMUNITY): Payer: BLUE CROSS/BLUE SHIELD

## 2016-05-25 ENCOUNTER — Encounter (HOSPITAL_COMMUNITY): Payer: Self-pay

## 2016-05-25 DIAGNOSIS — G8918 Other acute postprocedural pain: Secondary | ICD-10-CM | POA: Diagnosis not present

## 2016-05-25 DIAGNOSIS — E119 Type 2 diabetes mellitus without complications: Secondary | ICD-10-CM | POA: Diagnosis not present

## 2016-05-25 DIAGNOSIS — R079 Chest pain, unspecified: Secondary | ICD-10-CM | POA: Diagnosis present

## 2016-05-25 DIAGNOSIS — I10 Essential (primary) hypertension: Secondary | ICD-10-CM | POA: Insufficient documentation

## 2016-05-25 DIAGNOSIS — Z7984 Long term (current) use of oral hypoglycemic drugs: Secondary | ICD-10-CM | POA: Diagnosis not present

## 2016-05-25 DIAGNOSIS — R0789 Other chest pain: Secondary | ICD-10-CM | POA: Insufficient documentation

## 2016-05-25 DIAGNOSIS — K5900 Constipation, unspecified: Secondary | ICD-10-CM | POA: Diagnosis not present

## 2016-05-25 LAB — BASIC METABOLIC PANEL
Anion gap: 8 (ref 5–15)
BUN: 11 mg/dL (ref 6–20)
CALCIUM: 8.8 mg/dL — AB (ref 8.9–10.3)
CO2: 23 mmol/L (ref 22–32)
CREATININE: 0.58 mg/dL (ref 0.44–1.00)
Chloride: 103 mmol/L (ref 101–111)
Glucose, Bld: 195 mg/dL — ABNORMAL HIGH (ref 65–99)
Potassium: 4 mmol/L (ref 3.5–5.1)
SODIUM: 134 mmol/L — AB (ref 135–145)

## 2016-05-25 LAB — CBC
HCT: 34.4 % — ABNORMAL LOW (ref 36.0–46.0)
Hemoglobin: 11.6 g/dL — ABNORMAL LOW (ref 12.0–15.0)
MCH: 27.7 pg (ref 26.0–34.0)
MCHC: 33.7 g/dL (ref 30.0–36.0)
MCV: 82.1 fL (ref 78.0–100.0)
PLATELETS: 436 10*3/uL — AB (ref 150–400)
RBC: 4.19 MIL/uL (ref 3.87–5.11)
RDW: 14.7 % (ref 11.5–15.5)
WBC: 11.3 10*3/uL — AB (ref 4.0–10.5)

## 2016-05-25 LAB — I-STAT TROPONIN, ED: TROPONIN I, POC: 0 ng/mL (ref 0.00–0.08)

## 2016-05-25 MED ORDER — SODIUM CHLORIDE 0.9 % IJ SOLN
INTRAMUSCULAR | Status: AC
  Filled 2016-05-25: qty 50

## 2016-05-25 MED ORDER — IOPAMIDOL (ISOVUE-370) INJECTION 76%
INTRAVENOUS | Status: AC
  Filled 2016-05-25: qty 100

## 2016-05-25 MED ORDER — DOCUSATE SODIUM 50 MG/5ML PO LIQD: 100.0000 mg | Freq: Two times a day (BID) | ORAL | 0 refills | Status: DC | PRN

## 2016-05-25 MED ORDER — SULFAMETHOXAZOLE-TRIMETHOPRIM 200-40 MG/5ML PO SUSP: 20.0000 mL | Freq: Two times a day (BID) | ORAL | 0 refills | Status: DC

## 2016-05-25 MED ORDER — IOPAMIDOL (ISOVUE-370) INJECTION 76%
100.0000 mL | Freq: Once | INTRAVENOUS | Status: AC | PRN
  Administered 2016-05-25: 100 mL via INTRAVENOUS

## 2016-05-25 NOTE — ED Provider Notes (Signed)
[facility]-EMERGENCY DEPT Provider Note   CSN: [id number] Arrival date & time: 05/25/16  1216     History   Chief Complaint Chief Complaint  Patient presents with  . Chest Pain  . Constipation  . Post-op Problem    HPI Rachel Stewart is a 45 y.o. female.  HPI   Pt with hx DM, HTN, GERD, 5 days postop after Roux en Y gastric bypass presents with several concerns: wound leakage, constipation, CP/DOE.   Pt has been following her diet of protein shakes, did have one cream of chicken soup.  Has been passing gas but feels she has to push hard to do that. Has not had any form of bowel movement.  Denies vomiting.  Has felt hot and cold at night the past two nights.  No measured fevers.  No increased abdominal discomfort.  On the way to the ED pt developed central chest pain that is intermittent, lasts about 5 minutes at a time, seems worse with lying back.  Has been coughing.  Recently had URI/sinusitis and was on amoxicillin but did not finish antibiotics.      Past Medical History:  Diagnosis Date  . Bronchitis, chronic (HCC)    last tx. 8'17  . Diabetes mellitus without complication (HCC)   . GERD (gastroesophageal reflux disease)   . Hypertension   . Neuromuscular disorder (HCC)    "tic pain" left face near ear area. Carpal tunnel bilateral. Arthritis knee.  . Sleep apnea   . Ventral hernia    at present    Patient Active Problem List   Diagnosis Date Noted  . 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 Morbid obesity (HCC) 05/19/2016    Past Surgical History:  Procedure Laterality Date  . BREAST SURGERY     breast reduction  . 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 SPINE SURGERY     lumbar decompression    OB History    No data available        Home Medications    Prior to Admission medications   Medication Sig Start Date End Date Taking? Authorizing Provider  albuterol (PROVENTIL HFA;VENTOLIN HFA) 108 (90 Base) MCG/ACT inhaler Inhale 2 puffs into the lungs every 4 (four) hours as needed for wheezing or shortness of breath.   Yes Historical Provider, MD  amLODipine (NORVASC) 10 MG tablet Take 10 mg by mouth daily. 03/17/16  Yes Historical Provider, MD  glimepiride (AMARYL) 4 MG tablet Take 4 mg by mouth 2 (two) times daily.   Yes Historical Provider, MD  JENTADUETO XR 2.10-998 MG TB24 Take 2 tablets by mouth daily. 03/10/16  Yes Historical Provider, MD  metoprolol succinate (TOPROL-XL) 50 MG 24 hr tablet Take 50 mg by mouth daily. 03/17/16  Yes Historical Provider, MD  Multiple Vitamin (MULTIVITAMIN WITH MINERALS) TABS tablet Take 1 tablet by mouth daily.   Yes Historical Provider, MD  omeprazole (PRILOSEC) 40 MG capsule Take 40 mg by mouth daily.   Yes Historical Provider, MD  oxyCODONE (ROXICODONE) 5 MG/5ML solution Take 5-10 mLs by mouth every 4 (four) hours as needed for pain. 05/18/16  Yes Historical Provider, MD  docusate (COLACE) 50 MG/5ML liquid Take 10 mLs (100 mg total) by mouth 2 (two) times daily as needed for mild constipation or  moderate constipation. 05/25/16   Trixie Dredge, PA-C  sulfamethoxazole-trimethoprim (BACTRIM,SEPTRA) 200-40 MG/5ML suspension Take 20 mLs by mouth 2 (two) times daily. 05/25/16   Trixie Dredge, PA-C    Family History Family History  Problem Relation Age of Onset  . Aneurysm Mother   . Hypertension Sister     Social History Social History  Substance Use Topics  . Smoking status: Never Smoker  . Smokeless tobacco: Never Used  . Alcohol use No     Allergies   Lisinopril   Review of Systems Review of Systems  All other systems reviewed and are negative.    Physical Exam Updated Vital Signs BP 113/79   Pulse 101   Temp 98.7 F (37.1 C) (Oral)   Resp 16   Ht 5\' 3"  (1.6 m)    Wt 98.9 kg   LMP 05/06/2016   SpO2 98%   BMI 38.62 kg/m   Physical Exam  Constitutional: She appears well-developed and well-nourished. No distress.  HENT:  Head: Normocephalic and atraumatic.  Neck: Neck supple.  Cardiovascular: Normal rate and regular rhythm.   Pulmonary/Chest: Effort normal and breath sounds normal. No respiratory distress. She has no wheezes. She has no rales.  Abdominal: Soft. Bowel sounds are normal. She exhibits no distension. There is no rebound and no guarding.    Obese. Very mild upper abdominal tenderness.   Surgical incisions with one exception clean, dry, and intact without erythema, tenderness or discharge.  Surgical glue overlying.    Neurological: She is alert.  Skin: She is not diaphoretic.  Nursing note and vitals reviewed.    ED Treatments / Results  Labs (all labs ordered are listed, but only abnormal results are displayed) Labs Reviewed  BASIC METABOLIC PANEL - Abnormal; Notable for the following:       Result Value   Sodium 134 (*)    Glucose, Bld 195 (*)    Calcium 8.8 (*)    All other components within normal limits  CBC - Abnormal; Notable for the following:    WBC 11.3 (*)    Hemoglobin 11.6 (*)    HCT 34.4 (*)    Platelets 436 (*)    All other components within normal limits  I-STAT TROPOININ, ED    EKG  EKG Interpretation None       Radiology Dg Chest 2 View  Result Date: 05/25/2016 CLINICAL DATA:  Chest pain EXAM: CHEST  2 VIEW COMPARISON:  09/10/2015 FINDINGS: Cardiomediastinal silhouette is stable. No acute infiltrate or pleural effusion. No pulmonary edema. Mild degenerative changes lower thoracic spine. IMPRESSION: No active cardiopulmonary disease. Electronically Signed   By: Natasha Mead M.D.   On: 05/25/2016 12:58   Ct Angio Chest Pe W And/or Wo Contrast  Result Date: 05/25/2016 CLINICAL DATA:  Status post laparoscopic Roux-en-Y gastric bypass with hiatal hernia repair on 05/09/2016. Purulent discharge  from the surgical incision. Chest pain and dyspnea on exertion. EXAM: CT ANGIOGRAPHY CHEST CT ABDOMEN AND PELVIS WITH CONTRAST TECHNIQUE: Multidetector CT imaging of the chest was performed using the standard protocol during bolus administration of intravenous contrast. Multiplanar CT image reconstructions and MIPs were obtained to evaluate the vascular anatomy. Multidetector CT imaging of the abdomen and pelvis was performed using the standard protocol during bolus administration of intravenous contrast. CONTRAST:  100 cc Isovue 370 COMPARISON:  Chest radiographs obtained earlier today. FINDINGS: CTA CHEST FINDINGS Cardiovascular: Satisfactory opacification of the pulmonary arteries to the segmental level. No evidence of pulmonary embolism. Normal heart size.  No pericardial effusion. Mediastinum/Nodes: No enlarged mediastinal, hilar, or axillary lymph nodes. Thyroid gland, trachea, and esophagus demonstrate no significant findings. Lungs/Pleura: Left lower lobe and lingular linear atelectasis. Musculoskeletal: Thoracic spine degenerative changes. Review of the MIP images confirms the above findings. CT ABDOMEN and PELVIS FINDINGS Hepatobiliary: Mild diffuse low density of the liver relative to the spleen. Normal appearing gallbladder. Pancreas: Unremarkable. No pancreatic ductal dilatation or surrounding inflammatory changes. Spleen: Normal in size without focal abnormality. Adrenals/Urinary Tract: Small lower pole left renal cyst. Small upper pole left renal calculus, measuring 4 mm in maximum diameter. Normal appearing adrenal glands, right kidney, ureters and urinary bladder. Stomach/Bowel: Post gastric bypass changes in the upper abdomen. No bowel dilatation. No evidence of appendicitis. Minimal sigmoid colon diverticulosis. Vascular/Lymphatic: No significant vascular findings are present. No enlarged abdominal or pelvic lymph nodes. Reproductive: Uterus and bilateral adnexa are unremarkable. Other: Small to  moderate amount of free peritoneal air in the left upper abdomen in the region of recent surgery. There is also some postoperative air beneath the left anterolateral abdominal wall musculature. There is mild subcutaneous edema anteriorly on both sides, greater on the left. No discrete fluid collections suspicious for abscess are seen. Musculoskeletal: Lumbar spine degenerative changes. Review of the MIP images confirms the above findings. IMPRESSION: 1. No pulmonary emboli. 2. Upper abdominal postsurgical changes without abscess. 3. Mild diffuse hepatic steatosis. 4. 4 mm nonobstructing left renal calculus. Electronically Signed   By: Beckie SaltsSteven  Reid M.D.   On: 05/25/2016 16:11   Ct Abdomen Pelvis W Contrast  Result Date: 05/25/2016 CLINICAL DATA:  Status post laparoscopic Roux-en-Y gastric bypass with hiatal hernia repair on 05/09/2016. Purulent discharge from the surgical incision. Chest pain and dyspnea on exertion. EXAM: CT ANGIOGRAPHY CHEST CT ABDOMEN AND PELVIS WITH CONTRAST TECHNIQUE: Multidetector CT imaging of the chest was performed using the standard protocol during bolus administration of intravenous contrast. Multiplanar CT image reconstructions and MIPs were obtained to evaluate the vascular anatomy. Multidetector CT imaging of the abdomen and pelvis was performed using the standard protocol during bolus administration of intravenous contrast. CONTRAST:  100 cc Isovue 370 COMPARISON:  Chest radiographs obtained earlier today. FINDINGS: CTA CHEST FINDINGS Cardiovascular: Satisfactory opacification of the pulmonary arteries to the segmental level. No evidence of pulmonary embolism. Normal heart size. No pericardial effusion. Mediastinum/Nodes: No enlarged mediastinal, hilar, or axillary lymph nodes. Thyroid gland, trachea, and esophagus demonstrate no significant findings. Lungs/Pleura: Left lower lobe and lingular linear atelectasis. Musculoskeletal: Thoracic spine degenerative changes. Review of the  MIP images confirms the above findings. CT ABDOMEN and PELVIS FINDINGS Hepatobiliary: Mild diffuse low density of the liver relative to the spleen. Normal appearing gallbladder. Pancreas: Unremarkable. No pancreatic ductal dilatation or surrounding inflammatory changes. Spleen: Normal in size without focal abnormality. Adrenals/Urinary Tract: Small lower pole left renal cyst. Small upper pole left renal calculus, measuring 4 mm in maximum diameter. Normal appearing adrenal glands, right kidney, ureters and urinary bladder. Stomach/Bowel: Post gastric bypass changes in the upper abdomen. No bowel dilatation. No evidence of appendicitis. Minimal sigmoid colon diverticulosis. Vascular/Lymphatic: No significant vascular findings are present. No enlarged abdominal or pelvic lymph nodes. Reproductive: Uterus and bilateral adnexa are unremarkable. Other: Small to moderate amount of free peritoneal air in the left upper abdomen in the region of recent surgery. There is also some postoperative air beneath the left anterolateral abdominal wall musculature. There is mild subcutaneous edema anteriorly on both sides, greater on the left. No discrete fluid collections suspicious for abscess  are seen. Musculoskeletal: Lumbar spine degenerative changes. Review of the MIP images confirms the above findings. IMPRESSION: 1. No pulmonary emboli. 2. Upper abdominal postsurgical changes without abscess. 3. Mild diffuse hepatic steatosis. 4. 4 mm nonobstructing left renal calculus. Electronically Signed   By: Beckie Salts M.D.   On: 05/25/2016 16:11    Procedures Procedures (including critical care time)  Medications Ordered in ED Medications  sodium chloride 0.9 % injection (not administered)  iopamidol (ISOVUE-370) 76 % injection (not administered)  iopamidol (ISOVUE-370) 76 % injection 100 mL (100 mLs Intravenous Contrast Given 05/25/16 1540)     Initial Impression / Assessment and Plan / ED Course  I have reviewed the  triage vital signs and the nursing notes.  Pertinent labs & imaging results that were available during my care of the patient were reviewed by me and considered in my medical decision making (see chart for details).  Clinical Course    Afebrile nontoxic patient who is 5 days postop from Roux-en-Y gastric bypass c/o constipation, some malodorous discharge from one of the surgical wounds.  She also developed chest pain while in route to the hospital, atypical.  CT angio chest is negative.  CT abd/pelvis is also negative.  Labs reassuring.  The abdominal incision indicated does have some very mild drainage that I think is a superficial issue.  Discussed pt, workup, and plan with Dr Erma Heritage.  Pt d/c with addition of liquid colace and encouragement to follow postoperative instructions and PO intake instructions exactly (pt not quite getting in all the fluids she is supposed to) and call surgery early this week.  Bactrim for possible superficial infection.  Discussed wound care with patient.  Discussed result, findings, treatment, and follow up  with patient.  Pt given return precautions.  Pt verbalizes understanding and agrees with plan.      Final Clinical Impressions(s) / ED Diagnoses   Final diagnoses:  Constipation, unspecified constipation type  Chest pain, unspecified type    New Prescriptions Discharge Medication List as of 05/25/2016  5:29 PM    START taking these medications   Details  docusate (COLACE) 50 MG/5ML liquid Take 10 mLs (100 mg total) by mouth 2 (two) times daily as needed for mild constipation or moderate constipation., Starting Sun 05/25/2016, Print    sulfamethoxazole-trimethoprim (BACTRIM,SEPTRA) 200-40 MG/5ML suspension Take 20 mLs by mouth 2 (two) times daily., Starting Sun 05/25/2016, Print         [place], PA-C 05/25/16 1837    Shaune Pollack, MD 05/26/16 [phone]

## 2016-05-25 NOTE — ED Notes (Signed)
Pt ambulated to restroom with a steady gait

## 2016-05-25 NOTE — ED Triage Notes (Signed)
Pt presents with c/o chest pain that started last night and continued through the night, constipation since her gastric bypass surgery on 12/11, and draining from her abdominal surgical site. Pt is in no distress at this time, alert and oriented.

## 2016-05-25 NOTE — Discharge Instructions (Signed)
Read the information below.  Use the prescribed medication as directed.  Please discuss all new medications with your pharmacist.  You may return to the Emergency Department at any time for worsening condition or any new symptoms that concern you.   If you develop high fevers, worsening abdominal pain, uncontrolled vomiting, or are unable to tolerate fluids by mouth, return to the ER for a recheck.  ° °

## 2016-05-26 ENCOUNTER — Telehealth (HOSPITAL_COMMUNITY): Payer: Self-pay

## 2016-06-03 ENCOUNTER — Encounter: Payer: Self-pay | Admitting: Skilled Nursing Facility1

## 2016-06-03 ENCOUNTER — Encounter: Payer: BLUE CROSS/BLUE SHIELD | Attending: Surgery | Admitting: Skilled Nursing Facility1

## 2016-06-03 DIAGNOSIS — Z6838 Body mass index (BMI) 38.0-38.9, adult: Secondary | ICD-10-CM | POA: Diagnosis not present

## 2016-06-03 DIAGNOSIS — Z9884 Bariatric surgery status: Secondary | ICD-10-CM

## 2016-06-03 NOTE — Progress Notes (Addendum)
Bariatric Class:  Appt start time: 1530 end time:  1630.  2 Week Post-Operative Nutrition Class  Patient was seen on 06/03/2016 for Post-[facility]ative Nutrition [facility].  Pts states she is off glimeperide, omeprezole,  and omladapine. Surgery date: 05/19/2016 Surgery type: RYGB Start weight at [facility]: 225 lbs on 09/11/2014 Weight today: 206.8 lbs Wt change (preop-today): 15.4  TANITA  BODY COMP RESULTS  03/17/16 06/03/2016   BMI (kg/m^2) 38.1 35.5   Fat Mass (lbs) 110.2 99.4   Fat Free Mass (lbs) 112 107.4   Total Body Water (lbs) 81.4 77.4    The following the learning objectives were met by the patient during this course:  Identifies Phase 3A (Soft, High Proteins) Dietary Goals and will begin from 2 weeks post-operatively to 2 months post-operatively  Identifies appropriate sources of fluids and proteins   States protein recommendations and appropriate sources post-operatively  Identifies the need for appropriate texture modifications, mastication, and bite sizes when consuming solids  Identifies appropriate multivitamin and calcium sources post-operatively  Describes the need for physical activity post-operatively and will follow MD recommendations  States when to call healthcare provider regarding medication questions or post-operative complications  Handouts given during class include:  Phase 3A: Soft, High Protein Diet Handout  Follow-Up Plan: Patient will follow-up at [facility] in 6 weeks for 2 month post-op nutrition visit for diet advancement per MD.

## 2016-06-10 NOTE — Telephone Encounter (Signed)
Made discharge phone call to patient per DROP protocol. Asking the following questions.    1. Do you have someone to care for you now that you are home?  Yes 2. Are you having pain now that is not relieved by your pain medication?  No 3. Are you able to drink the recommended daily amount of fluids (48 ounces minimum/day) and protein (60-80 grams/day) as prescribed by the dietitian or nutritional counselor?  Patient only consuming 16-20 ounces daily of fluid.  Patient re-educated on fluid goals. 4. Are you taking the vitamins and minerals as prescribed? Yes  5. Do you have the "on call" number to contact your surgeon if you have a problem or question?  Yes 6. Are your incisions free of redness, swelling or drainage? (If steri strips, address that these can fall off, shower as tolerated) Yes 7. Have your bowels moved since your surgery?  If not, are you passing gas?  "Yes, I had to take something over the counter to help me go"  8. Are you up and walking 3-4 times per day?  Yes 9. Were you provided your discharge medications before your surgery or before you were discharged from the hospital and are you taking them without problem?  Yes  Steve Gregg RN

## 2016-07-16 ENCOUNTER — Encounter: Payer: BLUE CROSS/BLUE SHIELD | Attending: Surgery | Admitting: Dietician

## 2016-07-16 ENCOUNTER — Encounter: Payer: Self-pay | Admitting: Dietician

## 2016-07-16 DIAGNOSIS — Z6838 Body mass index (BMI) 38.0-38.9, adult: Secondary | ICD-10-CM | POA: Diagnosis not present

## 2016-07-16 NOTE — Patient Instructions (Addendum)
Goals:  Follow Phase 3B: High Protein + Non-Starchy Vegetables  Eat 3-6 small meals/snacks, every 3-5 hrs  Increase lean protein foods to meet 60g goal  Get an insulated lunchbox  Keep a stash at work: vitamins, P3 packs, tuna pouches, deli meat, beans  Eat something with protein at least 4 times in a 24-hour period   Increase fluid intake to 64oz +  Avoid drinking 15 minutes before, during and 30 minutes after eating  Aim for >30 min of physical activity daily  Avoid starchy foods  Try [employer] BBQ sauce  Surgery date: 05/19/2016 Surgery type: RYGB [facility]: 225 lbs on 09/11/2014 Weight today: 195.4 lbs Wt change: 11.4 lbs Total weight lost: 30 lbs  TANITA  BODY COMP RESULTS  03/17/16 06/03/16 07/16/16   BMI (kg/m^2) 38.1 35.5 33.5   Fat Mass (lbs) 110.2 99.4 82.2   Fat Free Mass (lbs) 112 107.4 113.2   Total Body Water (lbs) 81.4 77.4 81

## 2016-07-16 NOTE — Progress Notes (Signed)
  Follow-up visit:  8 Weeks Post-Operative RYGB Surgery  Medical Nutrition Therapy:  Appt start time: 210 end time:  250  Primary concerns today: Post-operative Bariatric Surgery Nutrition Management. Rachel Stewart returns for bariatric nutrition follow up having lost a total of 30 pounds. She works 3rd shift and it is difficult to obtain 24-hour diet recall. She reports that she drinks 1/2 to a whole protein shake (Atkins or Premier) in a 24-hour period. She states that she will also eat beans, weenies, and scrambled eggs. She admits to not meeting protein and fluid needs.   Surgery date: 05/19/2016 Surgery type: RYGB [facility] lbs on 09/11/2014 Weight today: 195.4 lbs Wt change: 11.4 lbs Total weight lost: 30 lbs  TANITA  BODY COMP RESULTS  03/17/16 06/03/16 07/16/16   BMI (kg/m^2) 38.1 35.5 33.5   Fat Mass (lbs) 110.2 99.4 82.2   Fat Free Mass (lbs) 112 107.4 113.2   Total Body Water (lbs) 81.4 77.4 81    Preferred Learning Style:   No preference indicated   Learning Readiness:   Ready  Fluid intake: 17-34 oz water, 5-11 oz protein shake Estimated total protein intake: unable to determine  Medications: see list Supplementation: sometimes forgets  CBG monitoring: not testing Average CBG per patient: unknown Last patient reported A1c: unknown  Using straws: no Drinking while eating: sometimes sips, causes nausea Hair loss: unknown Carbonated beverages: no N/V/D/C: some nausea, diarrhea with coleslaw Dumping syndrome: none  Recent physical activity:  More active overall at work  Progress Towards Goal(s):  In progress.  Handouts given during visit include:  Phase 3B lean protein + non starchy vegetables   Nutritional Diagnosis:  [place]-3.3 Overweight/obesity related to past poor dietary habits and physical inactivity as evidenced by patient w/ recent RYGB surgery following dietary guidelines for continued weight loss.   Intervention:  Nutrition counseling  provided.  Teaching Method Utilized:  Visual Auditory Hands on  Barriers to learning/adherence to lifestyle change: unable to meet fluid and protein needs  Demonstrated degree of understanding via:  Teach Back   Monitoring/Evaluation:  Dietary intake, exercise, and body weight. Follow up in 3 months for 5 month post-op visit.

## 2016-08-20 ENCOUNTER — Ambulatory Visit: Payer: BLUE CROSS/BLUE SHIELD | Admitting: Skilled Nursing Facility1

## 2016-10-07 ENCOUNTER — Ambulatory Visit: Payer: BLUE CROSS/BLUE SHIELD | Admitting: Skilled Nursing Facility1

## 2017-08-07 ENCOUNTER — Encounter: Payer: Self-pay | Admitting: Internal Medicine

## 2017-08-07 ENCOUNTER — Ambulatory Visit: Payer: BLUE CROSS/BLUE SHIELD | Admitting: Internal Medicine

## 2017-08-07 VITALS — BP 146/98 | HR 90 | Temp 98.0°F | Resp 16 | Ht 64.5 in | Wt 196.0 lb

## 2017-08-07 DIAGNOSIS — E119 Type 2 diabetes mellitus without complications: Secondary | ICD-10-CM

## 2017-08-07 LAB — POCT GLYCOSYLATED HEMOGLOBIN (HGB A1C): Hemoglobin A1C: 6.9

## 2017-08-07 NOTE — Patient Instructions (Addendum)
Please continue off diabetes medications.  Please look up: Rip Esselstyn - The Engine 2 diet   Rip Esselstyn - The Engine 2 7-day Rescue diet  Please come back for a follow-up appointment in 3 months  PATIENT INSTRUCTIONS FOR TYPE 2 DIABETES:  **Please join MyChart!** - see attached instructions about how to join if you have not done so already.  DIET AND EXERCISE Diet and exercise is an important part of diabetic treatment.  We recommended aerobic exercise in the form of brisk walking (working between 40-60% of maximal aerobic capacity, similar to brisk walking) for 150 minutes per week (such as 30 minutes five days per week) along with 3 times per week performing 'resistance' training (using various gauge rubber tubes with handles) 5-10 exercises involving the major muscle groups (upper body, lower body and core) performing 10-15 repetitions (or near fatigue) each exercise. Start at half the above goal but build slowly to reach the above goals. If limited by weight, joint pain, or disability, we recommend daily walking in a swimming pool with water up to waist to reduce pressure from joints while allow for adequate exercise.    BLOOD GLUCOSES Monitoring your blood glucoses is important for continued management of your diabetes. Please check your blood glucoses 2-4 times a day: fasting, before meals and at bedtime (you can rotate these measurements - e.g. one day check before the 3 meals, the next day check before 2 of the meals and before bedtime, etc.).   HYPOGLYCEMIA (low blood sugar) Hypoglycemia is usually a reaction to not eating, exercising, or taking too much insulin/ other diabetes drugs.  Symptoms include tremors, sweating, hunger, confusion, headache, etc. Treat IMMEDIATELY with 15 grams of Carbs: . 4 glucose tablets .  cup regular juice/soda . 2 tablespoons raisins . 4 teaspoons sugar . 1 tablespoon honey Recheck blood glucose in 15 mins and repeat above if still  symptomatic/blood glucose <100.  RECOMMENDATIONS TO REDUCE YOUR RISK OF DIABETIC COMPLICATIONS: * Take your prescribed MEDICATION(S) * Follow a DIABETIC diet: Complex carbs, fiber rich foods, (monounsaturated and polyunsaturated) fats * AVOID saturated/trans fats, high fat foods, >2,300 mg salt per day. * EXERCISE at least 5 times a week for 30 minutes or preferably daily.  * DO NOT SMOKE OR DRINK more than 1 drink a day. * Check your FEET every day. Do not wear tightfitting shoes. Contact us if you develop an ulcer * See your EYE doctor once a year or more if needed * Get a FLU shot once a year * Get a PNEUMONIA vaccine once before and once after age 50 years  GOALS:  * Your Hemoglobin A1c of <7%  * fasting sugars need to be <130 * after meals sugars need to be <180 (2h after you start eating) * Your Systolic BP should be 140 or lower  * Your Diastolic BP should be 80 or lower  * Your HDL (Good Cholesterol) should be 40 or higher  * Your LDL (Bad Cholesterol) should be 100 or lower. * Your Triglycerides should be 150 or lower  * Your Urine microalbumin (kidney function) should be <30 * Your Body Mass Index should be 25 or lower    Please consider the following ways to cut down carbs and fat and increase fiber and micronutrients in your diet: - substitute whole grain for white bread or pasta - substitute brown rice for white rice - substitute 90-calorie flat bread pieces for slices of bread when possible - substitute sweet potatoes  or yams for white potatoes - substitute humus for margarine - substitute tofu for cheese when possible - substitute almond or rice milk for regular milk (would not drink soy milk daily due to concern for soy estrogen influence on breast cancer risk) - substitute dark chocolate for other sweets when possible - substitute water - can add lemon or orange slices for taste - for diet sodas (artificial sweeteners will trick your body that you can eat sweets  without getting calories and will lead you to overeating and weight gain in the long run) - do not skip breakfast or other meals (this will slow down the metabolism and will result in more weight gain over time)  - can try smoothies made from fruit and almond/rice milk in am instead of regular breakfast - can also try old-fashioned (not instant) oatmeal made with almond/rice milk in am - order the dressing on the side when eating salad at a restaurant (pour less than half of the dressing on the salad) - eat as little meat as possible - can try juicing, but should not forget that juicing will get rid of the fiber, so would alternate with eating raw veg./fruits or drinking smoothies - use as little oil as possible, even when using olive oil - can dress a salad with a mix of balsamic vinegar and lemon juice, for e.g. - use agave nectar, stevia sugar, or regular sugar rather than artificial sweateners - steam or broil/roast veggies  - snack on veggies/fruit/nuts (unsalted, preferably) when possible, rather than processed foods - reduce or eliminate aspartame in diet (it is in diet sodas, chewing gum, etc) Read the labels!  Try to read Dr. Katherina RightNeal Barnard's book: "Program for Reversing Diabetes" for other ideas for healthy eating.

## 2017-08-07 NOTE — Progress Notes (Signed)
Patient ID: Rachel JoeBrenzle N Pott, female   DOB: 07/20/1970, 47 y.o.   MRN: [id number]   HPI: Rachel Stewart is a 47 y.o.-year-old female, referred by her bariatric surgeon, Dr. Luretha MurphyMatthew Martin, for management of DM2, dx in 2005, non-insulin-dependent, controlled, without long term complications.  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 She lost from 276 lbs to now 196 lbs (80 lbs lost net).  Last hemoglobin A1c was: 07/15/2017: HbA1c 6.6% Lab Results  Component Value Date   HGBA1C 11.9 (H) 05/14/2016   She is now on no medications for DM.  In the past, before her GBP, she has been on: - Metformin - Victoza - Jentadueto XL - Glimepiride   Pt is not checking sugars - battery died.  Strongly advised her to get a new one. - am: n/c - 2h after b'fast: n/c - before lunch: n/c - 2h after lunch: n/c - before dinner: n/c - 2h after dinner: n/c - bedtime: n/c - nighttime: n/c  Glucometer: One Touch Ultra Mini  Pt's meals are: - Breakfast: eggs + bacon - Lunch: wrap - Dinner: Lasagna, soup - Snacks: Meats skin, nuts, sugar-free popsicles  - no CKD, last BUN/creatinine:  07/15/2017: 16/0.69 Lab Results  Component Value Date   BUN 11 05/25/2016   BUN 15 05/14/2016   CREATININE 0.58 05/25/2016   CREATININE 0.90 05/19/2016  Not on an ACE inhibitor/ARB  - last set of lipids: 07/15/2017: 155/156/45/85 No results found for: CHOL, HDL, LDLCALC, LDLDIRECT, TRIG, CHOLHDL  Not on a statin.  - last eye exam was in 03/24/17. ? DR. "I had bleeding behind my eye".  - no numbness and tingling in her feet.  Pt has FH of DM in M and 3 sisters.  She has HTN, OSA - not compliant with C-pap. She also has anemia >> on Fe. On MVI, B12, Ca tid.   ROS: Constitutional: + Weight gain, + fatigue, no subjective hyperthermia/hypothermia, + excessive urination, poor sleep Eyes: no blurry vision, no xerophthalmia ENT: + Sore throat, no nodules palpated in throat, no dysphagia/odynophagia, no  hoarseness Cardiovascular: no CP/SOB/palpitations/leg swelling Respiratory: + Cough/no SOB Gastrointestinal: no N/V/D/C Musculoskeletal: no muscle/joint aches Skin: no rashes, + easy bruising Neurological: no tremors/numbness/tingling/dizziness Psychiatric: no depression/anxiety , + Low libido  Past Medical History:  Diagnosis Date  . Bronchitis, chronic (HCC)    last tx. 8'17  . Diabetes mellitus without complication (HCC)   . GERD (gastroesophageal reflux disease)   . Hypertension   . Neuromuscular disorder (HCC)    "tic pain" left face near ear area. Carpal tunnel bilateral. Arthritis knee.  . Sleep apnea   . Ventral hernia    at present   Past Surgical History:  Procedure Laterality Date  . BREAST SURGERY     breast reduction  . 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 SPINE SURGERY     lumbar decompression   Social History   Socioeconomic History  . Marital status: Single    Spouse name: Not on file  . Number of children: No  Social Needs  Occupational History  . N/A  Tobacco Use  . Smoking status: Never Smoker  . Smokeless tobacco: Never Used  Substance and Sexual Activity  . Alcohol use: No  . Drug use: No   Current Outpatient Medications on File  Prior to Visit  Medication Sig Dispense Refill  . Multiple Vitamin (MULTIVITAMIN WITH MINERALS) TABS tablet Take 1 tablet by mouth daily.     No current facility-administered medications on file prior to visit.    Allergies  Allergen Reactions  . Lisinopril Cough   Family History  Problem Relation Age of Onset  . Aneurysm Mother   . Hypertension Sister     PE: BP (!) 146/98 (BP Location: Left Arm, Patient Position: Sitting, Cuff Size: Small)   Pulse 90   Temp 98 F (36.7 C) (Oral)    Resp 16   Ht 5' 4.5" (1.638 m)   Wt 196 lb (88.9 kg)   SpO2 99%   BMI 33.12 kg/m   Wt Readings from Last 3 Encounters:  08/07/17 196 lb (88.9 kg)  07/16/16 195 lb 6.4 oz (88.6 kg)  06/03/16 206 lb 12.8 oz (93.8 kg)   Constitutional: overweight, in NAD Eyes: PERRLA, EOMI, no exophthalmos ENT: moist mucous membranes, no thyromegaly, no cervical lymphadenopathy Cardiovascular: RRR, No MRG Respiratory: CTA B Gastrointestinal: abdomen soft, NT, ND, BS+ Musculoskeletal: no deformities, strength intact in all 4 Skin: moist, warm, no rashes Neurological: no tremor with outstretched hands, DTR normal in all 4  ASSESSMENT: 1. DM2, non-insulin-dependent, controlled, without long term complications  PLAN:  1. Patient with long-standing, diet-controlled diabetes. HbA1c today: 6.9%.  I actually received the records from her surgeon after we checked the HbA1c.  She had another A1c earlier this month, which was 6.6%.  Her diabetes appears to be still well controlled, but she is not checking sugars and she has gained some weight, both risk factors for worsening of diabetes control.  We discussed at length about improving her diet (as of now this is a high fat diet) and I suggested healthier alternatives.  I also strongly advised her to start checking her sugars every day or every other day and will review her log when she comes back.  For now, I do not feel that we absolutely need to start her on treatment, but we will recheck her HbA1c and her sugar log in 3 months after she implemented healthier changes in her diet and hopefully loses more weight, and will decide at that time whether we need to start treatment. - I suggested to:  Patient Instructions  Please continue off diabetes medications.  Please look up: Rip Esselstyn - The Engine 2 diet   Rip Esselstyn - The Engine 2 7-day Rescue diet  Please come back for a follow-up appointment in 3 months  - given sugar log and advised how to fill it  and to bring it at next appt  - given foot care handout and explained the principles  - given instructions for hypoglycemia management "15-15 rule"  - advised for yearly eye exams  - Return to clinic in 3 mo with sugar log   .xttime  Carlus Pavlov, MD PhD [facility]

## 2017-11-13 ENCOUNTER — Ambulatory Visit: Payer: BLUE CROSS/BLUE SHIELD | Admitting: Internal Medicine

## 2017-11-13 NOTE — Progress Notes (Deleted)
Patient ID: Rachel Stewart, female   DOB: 02/08/1971, 47 y.o.   MRN: [id number]   HPI: Rachel Stewart is a 47 y.o.-year-old female, initially referred by her bariatric surgeon, Dr. Luretha Murphy, returning for follow-up for  DM2, dx in 2005, non-insulin-dependent, controlled, without long term complications.  Last visit 3 months ago.  She had Roux-en-Y surgery in 05/2016.  She lost approximately 80 pounds since then.  Her diabetes improved significantly so she could come off her diabetes medicines.  Last hemoglobin A1c was: 07/15/2017: HbA1c 6.6% Lab Results  Component Value Date   HGBA1C 6.9 08/07/2017   HGBA1C 11.9 (H) 05/14/2016   She is now not on any medications for her diabetes.  Before her gastric bypass surgery, she has been on: - Metformin - Victoza - Jentadueto XL - Glimepiride  She is checking sugars 0 to 1x a day: - am: n/c - 2h after b'fast: n/c - before lunch: n/c - 2h after lunch: n/c - before dinner: n/c - 2h after dinner: n/c - bedtime: n/c - nighttime: n/c  Glucometer: One Touch Ultra Mini  Pt's meals are: - Breakfast: eggs + bacon - Lunch: wrap - Dinner: Lasagna, soup - Snacks: Meats skin, nuts, sugar-free popsicles  -No CKD, last BUN/creatinine:  07/15/2017: 16/0.69 Lab Results  Component Value Date   BUN 11 05/25/2016   BUN 15 05/14/2016   CREATININE 0.58 05/25/2016   CREATININE 0.90 05/19/2016  She is not on an ACE inhibitor/ARB.  - l + HL; Ast set of lipids: 07/15/2017: 155/156/45/85 No results found for: CHOL, HDL, LDLCALC, LDLDIRECT, TRIG, CHOLHDL  Not on a statin.  - last eye exam was in 03/2017: ?DR. "I had bleeding behind my eye".  -Denies numbness and tingling in her feet.  Pt has FH of DM in M and 3 sisters.  She has HTN, OSA-  not compliant with her CPAP. She also has anemia >> on Fe. On MVI, B12, Ca tid.   ROS: Constitutional: no weight gain/no weight loss, no fatigue, no subjective hyperthermia, no subjective  hypothermia Eyes: no blurry vision, no xerophthalmia ENT: no sore throat, no nodules palpated in throat, no dysphagia, no odynophagia, no hoarseness Cardiovascular: no CP/no SOB/no palpitations/no leg swelling Respiratory: no cough/no SOB/no wheezing Gastrointestinal: no N/no V/no D/no C/no acid reflux Musculoskeletal: no muscle aches/no joint aches Skin: no rashes, no hair loss Neurological: no tremors/no numbness/no tingling/no dizziness  I reviewed pt's medications, allergies, PMH, social hx, family hx, and changes were documented in the history of present illness. Otherwise, unchanged from my initial visit note.  Past Medical History:  Diagnosis Date  . Bronchitis, chronic (HCC)    last tx. 8'17  . Diabetes mellitus without complication (HCC)   . GERD (gastroesophageal reflux disease)   . Hypertension   . Neuromuscular disorder (HCC)    "tic pain" left face near ear area. Carpal tunnel bilateral. Arthritis knee.  . Sleep apnea   . Ventral hernia    at present   Past Surgical History:  Procedure Laterality Date  . BREAST SURGERY     breast reduction  . 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 SPINE SURGERY     lumbar decompression   Social History   Socioeconomic History  . Marital status: Single    Spouse name:  Not on file  . Number of children: No  Social Needs  Occupational History  . N/A  Tobacco Use  . Smoking status: Never Smoker  . Smokeless tobacco: Never Used  Substance and Sexual Activity  . Alcohol use: No  . Drug use: No   Current Outpatient Medications on File Prior to Visit  Medication Sig Dispense Refill  . Multiple Vitamin (MULTIVITAMIN WITH MINERALS) TABS tablet Take 1 tablet by mouth daily.     No current facility-administered  medications on file prior to visit.    Allergies  Allergen Reactions  . Lisinopril Cough   Family History  Problem Relation Age of Onset  . Aneurysm Mother   . Hypertension Sister     PE: There were no vitals taken for this visit. Wt Readings from Last 3 Encounters:  08/07/17 196 lb (88.9 kg)  07/16/16 195 lb 6.4 oz (88.6 kg)  06/03/16 206 lb 12.8 oz (93.8 kg)   Constitutional: overweight, in NAD Eyes: PERRLA, EOMI, no exophthalmos ENT: moist mucous membranes, no thyromegaly, no cervical lymphadenopathy Cardiovascular: RRR, No MRG Respiratory: CTA B Gastrointestinal: abdomen soft, NT, ND, BS+ Musculoskeletal: no deformities, strength intact in all 4 Skin: moist, warm, no rashes Neurological: no tremor with outstretched hands, DTR normal in all 4  ASSESSMENT: 1. DM2, non-insulin-dependent, controlled, without long term complications  2.Obesity   PLAN:  1. Patient with long-standing, diet-controlled diabetes, with significant improvement in her sugars after her gastric bypass surgery.  She lost approximately 80 pounds since the surgery.  She was able to come off all of her diabetes medications.  At last visit, her diabetes appeared to be still controlled, but she was not checking sugars and she gained some weight, both risk factors for worsening of diabetes control.  We discussed about improving her diet as she was then on a high fat diet and I suggested healthier alternatives.  I also strongly advised her to start checking her sugars.  I did not feel that we needed to start any medications for then, but we may need to add metformin in the near future.  - I suggested to:  Patient Instructions  Please continue off diabetes medications.  Please come back for a follow-up appointment in 6 months  - today, HbA1c is 7%  - continue checking sugars at different times of the day - check 1x a day, rotating checks - advised for yearly eye exams >> she is UTD - Return to clinic in 6  mo with sugar log   2.Obesity  - s/p GBP 05/2016 - Lost approximately 80 pounds since the surgery - Since last visit, ***  Carlus Pavlovristina Tamelia Michalowski, MD PhD [facility]

## 2017-11-17 ENCOUNTER — Encounter: Payer: Self-pay | Admitting: Internal Medicine

## 2017-11-17 ENCOUNTER — Ambulatory Visit: Payer: BLUE CROSS/BLUE SHIELD | Admitting: Internal Medicine

## 2017-11-17 VITALS — BP 160/108 | HR 86 | Ht 64.5 in | Wt 200.6 lb

## 2017-11-17 DIAGNOSIS — E119 Type 2 diabetes mellitus without complications: Secondary | ICD-10-CM

## 2017-11-17 LAB — POCT GLYCOSYLATED HEMOGLOBIN (HGB A1C): HEMOGLOBIN A1C: 7.2 % — AB (ref 4.0–5.6)

## 2017-11-17 NOTE — Patient Instructions (Signed)
Please work on your diet but we can stay off diabetes medications for now.  Please come back for a follow-up appointment in 3-4 months.

## 2017-11-17 NOTE — Progress Notes (Signed)
Patient ID: Rachel JoeBrenzle N Christley, female   DOB: 10/29/1970, 47 y.o.   MRN: [id number]   HPI: Rachel Stewart is a 47 y.o.-year-old female, initially referred by her bariatric surgeon, Dr. Luretha MurphyMatthew Martin, returning for follow-up for DM2, dx in 2005, non-insulin-dependent, controlled, without long term complications.  Last visit 3 months ago.  She had Roux-en-Y gastric bypass surgery in 05/2016.  She lost approximately 80 pounds since then her diabetes improved significantly so she is now off all of her medicines.  However, lately, she has been eating more she has been more stressed.  She gained few pounds since last visit.    Last hemoglobin A1c was: Lab Results  Component Value Date   HGBA1C 6.9 08/07/2017   HGBA1C 11.9 (H) 05/14/2016  07/15/2017: HbA1c 6.6%  She is not on any medications for her diabetes.  Before her gastric bypass surgery, she was on  Metformin  Victoza - few doses    Jentadueto XL  Glimepiride  She is not checking sugars! - am: n/c - 2h after b'fast: n/c - before lunch: n/c - 2h after lunch: n/c - before dinner: n/c - 2h after dinner: n/c - bedtime: n/c - nighttime: n/c  Glucometer: One Touch Ultra Mini  Pt's meals are: - Breakfast: eggs + bacon  - Lunch: wrap - Dinner: Lasagna, soup - Snacks: Meats skin, nuts, sugar-free popsicles  - No CKD, last BUN/creatinine:  07/15/2017: 16/0.69 Lab Results  Component Value Date   BUN 11 05/25/2016   BUN 15 05/14/2016   CREATININE 0.58 05/25/2016   CREATININE 0.90 05/19/2016  She is not on ACE inhibitor/ARB.  -+ HL; Last set of lipids: 07/15/2017: 155/156/45/85 No results found for: CHOL, HDL, LDLCALC, LDLDIRECT, TRIG, CHOLHDL  She is not on a statin.  - last eye exam was in 03/2017:?  DR. "I had bleeding behind my eye".  - no numbness and tingling in her feet.  Pt has FH of DM in M and 3 sisters.  She has HTN, OSA-  not compliant with her CPAP. She also has anemia >> on Fe. On MVI, B12,  calcium 3X a day  ROS: Constitutional: no weight gain/no weight loss, no fatigue, no subjective hyperthermia, + subjective hypothermia, + nocturia Eyes: + blurry vision, no xerophthalmia ENT: no sore throat, no nodules palpated in throat, no dysphagia, no odynophagia, no hoarseness Cardiovascular: no CP/no SOB/no palpitations/no leg swelling Respiratory: no cough/no SOB/no wheezing Gastrointestinal: no N/no V/no D/no C/no acid reflux Musculoskeletal: + muscle aches/+ joint aches Skin: no rashes, + hair loss Neurological: no tremors/no numbness/no tingling/no dizziness, + HA + Irregular menses  I reviewed pt's medications, allergies, PMH, social hx, family hx, and changes were documented in the history of present illness. Otherwise, unchanged from my initial visit note.  Past Medical History:  Diagnosis Date  . Bronchitis, chronic (HCC)    last tx. 8'17  . Diabetes mellitus without complication (HCC)   . GERD (gastroesophageal reflux disease)   . Hypertension   . Neuromuscular disorder (HCC)    "tic pain" left face near ear area. Carpal tunnel bilateral. Arthritis knee.  . Sleep apnea   . Ventral hernia    at present   Past Surgical History:  Procedure Laterality Date  . BREAST SURGERY     breast reduction  . 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 SPINE SURGERY     lumbar decompression   Social History   Socioeconomic History  . Marital status: Single    Spouse name: Not on file  . Number of children: No  Social Needs  Occupational History  . N/A  Tobacco Use  . Smoking status: Never Smoker  . Smokeless tobacco: Never Used  Substance and Sexual Activity  . Alcohol use: No  . Drug use: No   Current Outpatient Medications on File Prior to Visit   Medication Sig Dispense Refill  . Multiple Vitamin (MULTIVITAMIN WITH MINERALS) TABS tablet Take 1 tablet by mouth daily.     No current facility-administered medications on file prior to visit.    Allergies  Allergen Reactions  . Lisinopril Cough   Family History  Problem Relation Age of Onset  . Aneurysm Mother   . Hypertension Sister     PE: BP (!) 160/108   Pulse 86   Ht 5' 4.5" (1.638 m)   Wt 200 lb 9.6 oz (91 kg)   LMP 11/10/2017   SpO2 97%   BMI 33.90 kg/m  Wt Readings from Last 3 Encounters:  11/17/17 200 lb 9.6 oz (91 kg)  08/07/17 196 lb (88.9 kg)  07/16/16 195 lb 6.4 oz (88.6 kg)   Constitutional: overweight, in NAD Eyes: PERRLA, EOMI, no exophthalmos ENT: moist mucous membranes, no thyromegaly, no cervical lymphadenopathy Cardiovascular: RRR, No MRG Respiratory: CTA B Gastrointestinal: abdomen soft, NT, ND, BS+ Musculoskeletal: no deformities, strength intact in all 4 Skin: moist, warm, no rashes Neurological: no tremor with outstretched hands, DTR normal in all 4  ASSESSMENT: 1. DM2, non-insulin-dependent, controlled, without long term complications  2.Obesity   PLAN:  1. Patient with long-standing, diet-controlled diabetes, with significant improvement in her sugars after her gastric bypass surgery.  She lost approximately 80 pounds since the surgery.  She was able to come off all of her diabetes medications.  At last visit, her diabetes appeared to be still controlled, but she was not checking sugars and she gained some weight, both risk factors for worsening of diabetes control.  We discussed about improving her diet as she was then on a high fat diet and I suggested healthier alternatives.  I also strongly advised her to start checking her sugars.  I did not feel that we need to start any medications for then, but we may need to add metformin in the near future. - At this visit, she is still not checking sugars, but today, HbA1c is 7.2% (higher).  She  tells me she relaxed her diet since last visit and is hungry all the time and craving different foods.  We discussed about the need to improve this to gain back control on her diabetes and weight.  I did suggest metformin, DPP 4 inhibitor, oral, best, a GLP-1 receptor agonist, but for now she would like to work on her diet and stay off medicines.  She agrees to work on this over the next 3 to 4 months and then will check another A1c at next visit.  If higher, will need to start 1 of the above medicines.  I strongly advised her to start checking sugars at different times of the day - check 1x a day, rotating checks - I suggested to:  Patient Instructions  Please work on your diet but we can stay off diabetes medications for now.  Please come back for a follow-up appointment in 3-4 months.  -  advised for yearly eye exams >> she is UTD - Return to clinic in 3-4 mo with sugar log    2.Obesity  - Status post GBP 05/2016 - She lost approximately 80 pounds since the surgery  - Since last visit, she gained 4 pounds by relaxing her diet. - We discussed about starting a GLP-1 receptor agonist to help her with both diabetes and weight, but she refuses for now.  She would like to wait and work on her diet and we can readdress this at next visit.  Carlus Pavlov, MD PhD [facility] Endocrinology

## 2018-03-12 ENCOUNTER — Encounter: Payer: Self-pay | Admitting: Internal Medicine

## 2018-03-12 ENCOUNTER — Ambulatory Visit: Payer: BLUE CROSS/BLUE SHIELD | Admitting: Internal Medicine

## 2018-03-12 VITALS — BP 140/98 | HR 93 | Ht 64.5 in | Wt 201.0 lb

## 2018-03-12 DIAGNOSIS — E119 Type 2 diabetes mellitus without complications: Secondary | ICD-10-CM

## 2018-03-12 DIAGNOSIS — E669 Obesity, unspecified: Secondary | ICD-10-CM | POA: Diagnosis not present

## 2018-03-12 LAB — POCT GLYCOSYLATED HEMOGLOBIN (HGB A1C): HEMOGLOBIN A1C: 8.1 % — AB (ref 4.0–5.6)

## 2018-03-12 MED ORDER — SEMAGLUTIDE(0.25 OR 0.5MG/DOS) 2 MG/1.5ML ~~LOC~~ SOPN: 0.5000 mg | PEN_INJECTOR | SUBCUTANEOUS | 5 refills | Status: DC

## 2018-03-12 NOTE — Progress Notes (Signed)
Patient ID: Rachel Stewart, female   DOB: 06/10/1970, 47 y.o.   MRN: [id number]   HPI: Rachel Stewart is a 47 y.o.-year-old female, initially referred by her bariatric surgeon, Dr. Luretha Murphy, returning for follow-up for DM2, dx in 2005, non-insulin-dependent, controlled, without long term complications.  Last visit 4 months ago.  Patient has a history of Roux-en-Y gastric bypass surgery in 2017.  She initially lost approximately 80 pounds and her diabetes improved significantly since then and she was able to come off all of her medicines.  However, latest HbA1c obtained at last visit was 7.2%, higher.  At last visit, weight was slightly higher by 4 pounds she was more stressed and ate more.  Last hemoglobin A1c was: Lab Results  Component Value Date   HGBA1C 7.2 (A) 11/17/2017   HGBA1C 6.9 08/07/2017   HGBA1C 11.9 (H) 05/14/2016  07/15/2017: HbA1c 6.6%  She is not on any medications for her diabetes  However, before her gastric bypass surgery, she was on:  Metformin  Victoza - few doses    Jentadueto XL  Glimepiride  She was not checking sugars at last visit. - am: n/c - 2h after b'fast: n/c - before lunch: n/c - 2h after lunch: n/c - before dinner: n/c - 2h after dinner: n/c - bedtime: n/c - nighttime: n/c  Glucometer: One Touch Ultra Mini  Pt's meals are: - Breakfast: eggs + bacon  - Lunch: wrap - Dinner: Lasagna, soup - Snacks: Meats skin, nuts, sugar-free popsicles  -No CKD, last BUN/creatinine:  07/15/2017: 16/0.69 Lab Results  Component Value Date   BUN 11 05/25/2016   BUN 15 05/14/2016   CREATININE 0.58 05/25/2016   CREATININE 0.90 05/19/2016  She is not on an ACE inhibitor/ARB.  -+ HL; Last set of lipids: 07/15/2017: 155/156/45/85 No results found for: CHOL, HDL, LDLCALC, LDLDIRECT, TRIG, CHOLHDL  She is not on a statin.  - last eye exam was in 03/2017: Probably + DR: "I had bleeding behind my eye".  -She denies numbness and tingling  in her feet.  Pt has FH of DM in M and 3 sisters.  She has HTN, OSA-  not compliant with her CPAP.  She also has anemia and is on iron. On MVI, B12, calcium 3X a day  ROS: Constitutional: + weight gain/no weight loss, no fatigue, no subjective hyperthermia, no subjective hypothermia, + nocturia Eyes: no blurry vision, no xerophthalmia ENT: no sore throat, no nodules palpated in throat, no dysphagia, no odynophagia, no hoarseness Cardiovascular: no CP/no SOB/no palpitations/no leg swelling Respiratory: no cough/no SOB/no wheezing Gastrointestinal: no N/no V/no D/no C/no acid reflux Musculoskeletal: + muscle aches/+ joint aches Skin: no rashes, + hair loss Neurological: no tremors/no numbness/no tingling/no dizziness  I reviewed pt's medications, allergies, PMH, social hx, family hx, and changes were documented in the history of present illness. Otherwise, unchanged from my initial visit note.  Past Medical History:  Diagnosis Date  . Bronchitis, chronic (HCC)    last tx. 8'17  . Diabetes mellitus without complication (HCC)   . GERD (gastroesophageal reflux disease)   . Hypertension   . Neuromuscular disorder (HCC)    "tic pain" left face near ear area. Carpal tunnel bilateral. Arthritis knee.  . Sleep apnea   . Ventral hernia    at present   Past Surgical History:  Procedure Laterality Date  . BREAST SURGERY     breast reduction  . LAPAROSCOPIC ROUX-EN-Y GASTRIC BYPASS WITH HIATAL HERNIA REPAIR N/A 05/19/2016  Procedure: LAPAROSCOPIC ROUX-EN-Y GASTRIC BYPASS WITH HIATAL HERNIA REPAIR, UPPER ENDO;  Surgeon: Luretha Murphy, MD;  Location: [facility];  Service: General;  Laterality: N/A;  . SPINE SURGERY     lumbar decompression   Social History   Socioeconomic History  . Marital status: Single    Spouse name: Not on file  . Number of children: No  Social Needs  Occupational History  . N/A  Tobacco Use  . Smoking status: Never Smoker  . Smokeless tobacco: Never Used   Substance and Sexual Activity  . Alcohol use: No  . Drug use: No   Current Outpatient Medications on File Prior to Visit  Medication Sig Dispense Refill  . Multiple Vitamin (MULTIVITAMIN WITH MINERALS) TABS tablet Take 1 tablet by mouth daily.    Marland Kitchen tiZANidine (ZANAFLEX) 2 MG tablet TAKE 1 TABLET BY MOUTH EVERY 12 HOURS AS NEEDED FOR MUSCLE/JOINT PAIN  1  . amoxicillin (AMOXIL) 500 MG capsule     . medroxyPROGESTERone (PROVERA) 10 MG tablet Take 20 mg by mouth daily.  0   No current facility-administered medications on file prior to visit.    Allergies  Allergen Reactions  . Lisinopril Cough   Family History  Problem Relation Age of Onset  . Aneurysm Mother   . Hypertension Sister     PE: BP (!) 140/98   Pulse 93   Ht 5' 4.5" (1.638 m)   Wt 201 lb (91.2 kg)   LMP 02/24/2018   SpO2 96%   BMI 33.97 kg/m  Wt Readings from Last 3 Encounters:  03/12/18 201 lb (91.2 kg)  11/17/17 200 lb 9.6 oz (91 kg)  08/07/17 196 lb (88.9 kg)   Constitutional: overweight, in NAD Eyes: PERRLA, EOMI, no exophthalmos ENT: moist mucous membranes, no thyromegaly, no cervical lymphadenopathy Cardiovascular: RRR, No MRG Respiratory: CTA B Gastrointestinal: abdomen soft, NT, ND, BS+ Musculoskeletal: no deformities, strength intact in all 4 Skin: moist, warm, no rashes Neurological: no tremor with outstretched hands, DTR normal in all 4  ASSESSMENT: 1. DM2, non-insulin-dependent, controlled, without long term complications  2.Obesity   PLAN:  1. Patient with long-standing, now diet-controlled diabetes, after her gastric bypass surgery.  She initially lost a significant amount of weight and was able to come off all of her medicines but at last visit the HbA1c was higher, at 7.2% and at that time I suggested to restart metformin, DPP 4 inhibitor, or, best, a GLP-1 receptor agonist.  However, she refused, wanting to work on the diet and stay off the medicines.  I strongly advised her to start  checking sugars. - at this visit, she is still not checking sugars >> advised her to start as the first prerequisite for better diabetes control - However,  today, HbA1c is 8.1% (higher) >> I suggested Metformin (cannot swallow large pills) or GLP1 R agonist >> she agrees to try this. Discussed about Benefits and possible SEs. - At this visit - I suggested to:  Patient Instructions   Please start Ozempic 0.25 mg weekly in a.m. (for example on Sunday morning) x 4 weeks, then increase to 0.5 mg weekly in a.m. if no nausea or hypoglycemia.  Please come back for a follow-up appointment in 3 months.  - start checking sugars at different times of the day - check 1x a day, rotating checks - advised for yearly eye exams >> she is UTD, but due now - Return to clinic in 4 mo with sugar log    2.Obesity  -  Status post gastric bypass in 05/2016 -She lost approximately 80 pounds since the surgery and no significant weight gain since then -she agrees to start a GLP1 R agonist >> this will help with both her diabetes and her weight  Carlus Pavlov, MD PhD [facility] Endocrinology

## 2018-03-12 NOTE — Patient Instructions (Signed)
Please start Ozempic 0.25 mg weekly in a.m. (for example on Sunday morning) x 4 weeks, then increase to 0.5 mg weekly in a.m. if no nausea or hypoglycemia.  Please come back for a follow-up appointment in 3 months.

## 2018-03-12 NOTE — Addendum Note (Signed)
Addended by: Darliss Ridgel I on: 03/12/2018 03:26 PM   Modules accepted: Orders

## 2018-04-16 ENCOUNTER — Encounter: Payer: Self-pay | Admitting: Dietician

## 2018-04-16 ENCOUNTER — Encounter: Payer: BLUE CROSS/BLUE SHIELD | Attending: Surgery | Admitting: Dietician

## 2018-04-16 VITALS — Ht 66.0 in | Wt 200.7 lb

## 2018-04-16 DIAGNOSIS — E6609 Other obesity due to excess calories: Secondary | ICD-10-CM | POA: Diagnosis not present

## 2018-04-16 DIAGNOSIS — Z6832 Body mass index (BMI) 32.0-32.9, adult: Secondary | ICD-10-CM | POA: Diagnosis not present

## 2018-04-16 NOTE — Progress Notes (Signed)
Medical Nutrition Therapy: Visit start time: 1330  end time: 1415 Assessment:  Diagnosis: Obesity, s/p bariatric surgery Past medical history: DM type II, s/p Roux en Y gastric bypass Psychosocial issues/ stress concerns: none reported  Preferred learning method:  . Hands-on  Current weight: 200.7# with shoes  Height: 5\' 6"  Medications, supplements: Atarax, Semaglutide, see chart  Progress and evaluation: Pt with Roux-en-Y gastric bypass surgery in 2017 now experiencing wt regain and decreased insulin sensitivity leading to hyperglycemia. She had previously been able to come off of her DM type II medications per her report, but recent labs indicate she will need to restart if BG not brought under control. Has stopped taking all vitamins. She is approx 30# up from her achieved goal wt of 165# s/p bariatric surgery and c/o craving starch and refined sugar-heavy foods. 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 She finds it difficult to plan meals and often chooses the convenience of fast foods rather than shopping for / cooking food at home. Current diet is essentially void of vegetables (once every couple of weeks) and eats fruits only occasionally. She chooses both fried and non-fried meats.   Physical activity: boxing, cardio, zumba until recently; had a shoulder injury which has recently healed  Dietary Intake:  Usual eating pattern includes 3 meals and 0-1 snacks per day. Dining out frequency: 14 meals per week.  Breakfast after 3rd shift: grits + fried chicken patty, oatmeal + sausage patty, eggs with mayo and jell-o Snack: not usually Lunch: usually out, banana, pb&j sandwich from vending machine, m&ms, chicken salad on wheat bread, cheese toast on white bread Snack: Lil Debby cake occasionally Supper: chicken with cheese, noodle-less lasagna, cheese stuffed chicken, broccoli with ranch, sesame fried chicken, mozzarella sticks, [employer] rice and Frito burrito Snack:  not usually Beverages: coffee, water, SF juice, unsweetened tea with 12 pumps Splenda  Nutrition Care Education: Topics covered: bariatric diet post-op guidelines, importance of taking bariatric specific supplements and taking them daily for duration of life, how to reduce cravings and promote satiety at meal times, strategies to plan and prepare meals at home, sources of dietary fiber Basic nutrition: basic food groups, appropriate nutrient balance, appropriate meal and snack schedule, general nutrition guidelines    Weight control: benefits of weight control, determining reasonable weight goal, behavioral changes for weight loss Advanced nutrition: cooking techniques, dining out Other lifestyle changes: benefits of making changes, increasing motivation, readiness for change, identifying habits that need to change  Nutritional Diagnosis:  NB-1.6 Limited adherence to nutrition-related recommendations As related to bariatric surgery dietary guidelines.  As evidenced by wt regain of 30# x1 year, not taking bariatric MVI or Calcium supplements, minimal intake of vegetables and fruits, inadequate protein intake per bariatric diet guidelines, frequent fried food intake.  Intervention: Discussion as noted above. Pt was advised to start re-taking her bariatric MVI and Calcium supplements in order to prevent nutritional deficiencies. She was guided on how to pre-plan quick and easy meals and snacks around her work schedule and was reminded of bariatric food and beverage guidelines. She may try lower sodium frozen meals as a way to start eating out less often and work to increase fruit and vegetable intake, particularly vegetables, which will help improve fullness and satiety at meal times as well as promote improved BG control. May try a different brand of protein drink or powder as well  Education Materials given:  Marland Kitchen Bariatric surgery post-op lifestyle diet and guidelines . Plate  Planner: Bariatric  specific . Recipes with portions appropriate for bariatric patient . Sample meal pattern/ menus . Goals/ instructions  Learner/ who was taught:  . Patient   Level of understanding: . Partial understanding; needs review/ practice  Demonstrated degree of understanding via:   Teach back Learning barriers: . None . Motivation lacking  Willingness to learn/ readiness for change: . Hesitance, contemplating change  Monitoring and Evaluation:  Dietary intake, exercise, and body weight      follow up: prn

## 2018-04-16 NOTE — Patient Instructions (Addendum)
   Recommend Calcium (tums) twice daily and bariatric specific MVI such as bariatric advantage daily with food -- for life!!  Try to include fruits and vegetables more regularly. Include at least one serving per week of vegetables. The main focuses of your diet should be protein, then vegetables/ fruits, then starches. Moderate to low fat overall  Make a goal to plan meals more in advance. This will help you eat at home more and be able to include more vegetables and non-fried proteins  For frozen meals: choose options that have at least 15g protein and no more than 500mg  of sodium (ideally 300mg  or less) per meal  Get back to a regular exercise routine when you are feeling ready  Fairlife drinks - good option for snack or mini meal at work

## 2018-06-17 ENCOUNTER — Ambulatory Visit: Payer: BLUE CROSS/BLUE SHIELD | Admitting: Internal Medicine

## 2018-06-18 ENCOUNTER — Ambulatory Visit: Payer: BLUE CROSS/BLUE SHIELD | Admitting: Internal Medicine

## 2018-06-18 ENCOUNTER — Encounter: Payer: Self-pay | Admitting: Internal Medicine

## 2018-06-18 VITALS — BP 130/70 | HR 96 | Ht 64.5 in | Wt 199.0 lb

## 2018-06-18 DIAGNOSIS — E119 Type 2 diabetes mellitus without complications: Secondary | ICD-10-CM | POA: Insufficient documentation

## 2018-06-18 DIAGNOSIS — E669 Obesity, unspecified: Secondary | ICD-10-CM

## 2018-06-18 DIAGNOSIS — E785 Hyperlipidemia, unspecified: Secondary | ICD-10-CM | POA: Diagnosis not present

## 2018-06-18 DIAGNOSIS — E1165 Type 2 diabetes mellitus with hyperglycemia: Secondary | ICD-10-CM | POA: Diagnosis not present

## 2018-06-18 LAB — POCT GLYCOSYLATED HEMOGLOBIN (HGB A1C): HEMOGLOBIN A1C: 6.1 % — AB (ref 4.0–5.6)

## 2018-06-18 LAB — LIPID PANEL
CHOL/HDL RATIO: 4
Cholesterol: 139 mg/dL (ref 0–200)
HDL: 39.6 mg/dL (ref >39.00)
LDL CALC: 74 mg/dL (ref 0–99)
NonHDL: 99.23
TRIGLYCERIDES: 127 mg/dL (ref 0.0–149.0)
VLDL: 25.4 mg/dL (ref 0.0–40.0)

## 2018-06-18 LAB — MICROALBUMIN / CREATININE URINE RATIO
Creatinine,U: 154.2 mg/dL
MICROALB UR: 1.6 mg/dL (ref 0.0–1.9)
Microalb Creat Ratio: 1 mg/g (ref 0.0–30.0)

## 2018-06-18 MED ORDER — SITAGLIPTIN PHOSPHATE 100 MG PO TABS: 100.0000 mg | ORAL_TABLET | Freq: Every day | ORAL | 3 refills | Status: DC

## 2018-06-18 MED ORDER — GLUCOSE BLOOD VI STRP: ORAL_STRIP | 12 refills | Status: DC

## 2018-06-18 NOTE — Addendum Note (Signed)
Addended by: Darliss Ridgel I on: 06/18/2018 04:49 PM   Modules accepted: Orders

## 2018-06-18 NOTE — Patient Instructions (Addendum)
Please hold:  Ozempic  Start: - Januvia 100 mg before the first meal of the day  Start checking sugars 1x a day.  Please come back for a follow-up appointment in 3 months.

## 2018-06-18 NOTE — Progress Notes (Addendum)
Patient ID: [name], female   DOB: 01-26-1971, 48 y.o.   MRN: [id number]   HPI: SHANIQUE SVEUM is a 48 y.o.-year-old female, initially referred by her bariatric surgeon, Dr. Luretha Murphy, returning for follow-up for DM2, dx in 2005, non-insulin-dependent, controlled, without long term complications.  Last visit 3 months ago.  Reviewed and addended history: Patient has a history of Roux-en-Y gastric bypass surgery in 2017.  She initially lost approximately 80 pounds and her diabetes improved significantly since then and she was able to come off all of her medicines.    However, HbA1c levels increased afterwards so we had to start the GLP-1 receptor agonist at last visit.  Last hemoglobin A1c was: Lab Results  Component Value Date   HGBA1C 8.1 (A) 03/12/2018   HGBA1C 7.2 (A) 11/17/2017   HGBA1C 6.9 08/07/2017  07/15/2017: HbA1c 6.6%  At last visit we started: -Ozempic, now at 0.5 mg weekly in a.m.; however, she started to develop nausea in the last 1.5 months.  She is not sure whether this is related to Ozempic. She refused metformin at that time because of the large pills.  Before her gastric bypass surgery she was on:  Metformin  Victoza - few doses    Jentadueto XL  JanuMet XR  Glimepiride  She is still not checking sugars. - am: n/c - 2h after b'fast: n/c - before lunch: n/c - 2h after lunch: n/c - before dinner: n/c - 2h after dinner: n/c - bedtime: n/c - nighttime: n/c  Glucometer: One Touch Ultra Mini  Pt's meals are: - Breakfast: eggs + bacon  - Lunch: wrap - Dinner: Lasagna, soup - Snacks: nuts, sugar-free popsicles  Works 3rd shift  -gets off at 7 am.  -No CKD, last BUN/creatinine:  07/15/2017: 16/0.69 Lab Results  Component Value Date   BUN 11 05/25/2016   BUN 15 05/14/2016   CREATININE 0.58 05/25/2016   CREATININE 0.90 05/19/2016  Not on an ACE inhibitor/ARB.  -+ HL; Last set of lipids: 07/15/2017: 155/156/45/85 No results  found for: CHOL, HDL, LDLCALC, LDLDIRECT, TRIG, CHOLHDL  She is not on a statin.  - last eye exam was in 03/2017: Probably + DR: "I had bleeding behind my eye".  -No numbness and tingling in her feet.  Pt has FH of DM in M and 3 sisters.  She has HTN, OSA-not compliant with CPAP.  She also has anemia and takes iron.  On multivitamins, B12 vitamin, calcium 3 times a day  ROS: Constitutional: + Both weight gain and weight, + fatigue, no subjective hyperthermia, no subjective hypothermia, + nocturia Eyes: no blurry vision, no xerophthalmia ENT: no sore throat, no nodules palpated in neck, no dysphagia, no odynophagia, no hoarseness Cardiovascular: no CP/no SOB/no palpitations/no leg swelling Respiratory: no cough/no SOB/no wheezing Gastrointestinal: + N/no V/+ D/no C/no acid reflux Musculoskeletal: + Both muscle and joint pains Skin: no rashes, no hair loss Neurological: no tremors/no numbness/no tingling/no dizziness  I reviewed pt's medications, allergies, PMH, social hx, family hx, and changes were documented in the history of present illness. Otherwise, unchanged from my initial visit note.  Past Medical History:  Diagnosis Date  . Bronchitis, chronic (HCC)    last tx. 8'17  . Diabetes mellitus without complication (HCC)   . GERD (gastroesophageal reflux disease)   . Hypertension   . Neuromuscular disorder (HCC)    "tic pain" left face near ear area. Carpal tunnel bilateral. Arthritis knee.  . Sleep apnea   . Ventral  hernia    at present   Past Surgical History:  Procedure Laterality Date  . BREAST SURGERY     breast reduction  . 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 SPINE SURGERY     lumbar decompression   Social History   Socioeconomic History  .  Marital status: Single    Spouse name: Not on file  . Number of children: No  Social Needs  Occupational History  . N/A  Tobacco Use  . Smoking status: Never Smoker  . Smokeless tobacco: Never Used  Substance and Sexual Activity  . Alcohol use: No  . Drug use: No   Current Outpatient Medications on File Prior to Visit  Medication Sig Dispense Refill  . hydrOXYzine (ATARAX/VISTARIL) 10 MG tablet Take 10 mg by mouth 3 (three) times daily as needed.    . medroxyPROGESTERone (PROVERA) 10 MG tablet Take 20 mg by mouth daily.  0  . Multiple Vitamin (MULTIVITAMIN WITH MINERALS) TABS tablet Take 1 tablet by mouth daily.    . Semaglutide,0.25 or 0.5MG /DOS, (OZEMPIC, 0.25 OR 0.5 MG/DOSE,) 2 MG/1.5ML SOPN Inject 0.5 mg into the skin once a week. 2 pen 5  . tiZANidine (ZANAFLEX) 2 MG tablet TAKE 1 TABLET BY MOUTH EVERY 12 HOURS AS NEEDED FOR MUSCLE/JOINT PAIN  1  . amoxicillin (AMOXIL) 500 MG capsule      No current facility-administered medications on file prior to visit.    Allergies  Allergen Reactions  . Lisinopril Cough   Family History  Problem Relation Age of Onset  . Aneurysm Mother   . Hypertension Sister     PE: BP 130/70   Pulse 96   Ht 5' 4.5" (1.638 m)   Wt 199 lb (90.3 kg)   SpO2 97%   BMI 33.63 kg/m  Wt Readings from Last 3 Encounters:  06/18/18 199 lb (90.3 kg)  04/16/18 200 lb 11.2 oz (91 kg)  03/12/18 201 lb (91.2 kg)   Constitutional: overweight, in NAD Eyes: PERRLA, EOMI, no exophthalmos ENT: moist mucous membranes, no thyromegaly, no cervical lymphadenopathy Cardiovascular: tachycardia, RR, No MRG Respiratory: CTA B Gastrointestinal: abdomen soft, NT, ND, BS+ Musculoskeletal: no deformities, strength intact in all 4 Skin: moist, warm, no rashes Neurological: no tremor with outstretched hands, DTR normal in all 4  ASSESSMENT: 1. DM2, non-insulin-dependent, controlled, without long term complications  2.Obesity   3.  HL  PLAN:  1. Patient with  longstanding, deviously diet-controlled diabetes after her gastric bypass surgery after which she lost a significant amount of weight and she was able to come off all of her medicines for diabetes.  However, at last visit, her HbA1c was higher, at 8.1% and I suggested metformin but she could not swallow large pills so we ended up starting a GLP-1 receptor agonist.  She was not checking sugars at that time and I strongly advised her to start. -At this visit, she is still not checking sugars and I again advised her to start.  I sent the strips to her pharmacy - today, HbA1c is 6.1% (much better) -Unfortunately, since she has nausea possibly with Ozempic, I advised her to keep Ozempic but put it on hold for now and only use Januvia.  In the future, we may retry Ozempic - I suggested to:  Patient Instructions  Please hold:  Ozempic  Start: - Januvia 100 mg before the first meal of the day  Start checking sugars 1x a day.  Please come back for a follow-up appointment in 3 months.  -Start checking sugars at different times of the day - check 1x a day, rotating checks - advised for yearly eye exams >> she is due - We will check annual labs today - Return to clinic in 3 mo with sugar log     2.Obesity  - s/p gastric bypass in 05/2016 -Status post gastric bypass in 05/2016 -She lost approximately 80 pounds since her surgery but then she plateaued -We started a GLP-1 receptor agonist at last visit, which should also help - but no wt loss since starting -Unfortunately, due to nausea, will switch to Januvia, which is weight neutral  3. HL - Reviewed latest lipid panel from last year: LDL at goal, triglycerides slightly high -She is not on a statin -She is due for a new lipid panel -We will check this today  Component     Latest Ref Rng & Units 06/18/2018  Glucose     65 - 99 mg/dL 161 (H)  BUN     7 - 25 mg/dL 12  Creatinine     0.96 - 1.10 mg/dL 0.45  GFR, Est Non African American     >  OR = 60 mL/min/1.53m2 92  GFR, Est African American     > OR = 60 mL/min/1.98m2 107  BUN/Creatinine Ratio     6 - 22 (calc) NOT APPLICABLE  Sodium     135 - 146 mmol/L 137  Potassium     3.5 - 5.3 mmol/L 4.3  Chloride     98 - 110 mmol/L 108  CO2     20 - 32 mmol/L 23  Calcium     8.6 - 10.2 mg/dL 8.5 (L)  Total Protein     6.1 - 8.1 g/dL 7.1  Albumin MSPROF     3.6 - 5.1 g/dL 3.3 (L)  Globulin     1.9 - 3.7 g/dL (calc) 3.8 (H)  AG Ratio     1.0 - 2.5 (calc) 0.9 (L)  Total Bilirubin     0.2 - 1.2 mg/dL 0.4  Alkaline phosphatase (APISO)     33 - 115 U/L 80  AST     10 - 35 U/L 13  ALT     6 - 29 U/L 10  Cholesterol     0 - 200 mg/dL 409  Triglycerides     0.0 - 149.0 mg/dL 811.9  HDL Cholesterol     >39.00 mg/dL 14.78  VLDL     0.0 - 29.5 mg/dL 62.1  LDL (calc)     0 - 99 mg/dL 74  Total CHOL/HDL Ratio      4  NonHDL      99.23  Microalb, Ur     0.0 - 1.9 mg/dL 1.6  Creatinine,U     mg/dL 308.6  MICROALB/CREAT RATIO     0.0 - 30.0 mg/g 1.0   ACR, GFR and lipids. Low albumin, slightly high Globulin.  Carlus Pavlov, MD PhD [facility] Endocrin[facility]

## 2018-06-19 LAB — COMPLETE METABOLIC PANEL WITH GFR
AG Ratio: 0.9 (calc) — ABNORMAL LOW (ref 1.0–2.5)
ALBUMIN MSPROF: 3.3 g/dL — AB (ref 3.6–5.1)
ALKALINE PHOSPHATASE (APISO): 80 U/L (ref 33–115)
ALT: 10 U/L (ref 6–29)
AST: 13 U/L (ref 10–35)
BUN: 12 mg/dL (ref 7–25)
CO2: 23 mmol/L (ref 20–32)
Calcium: 8.5 mg/dL — ABNORMAL LOW (ref 8.6–10.2)
Chloride: 108 mmol/L (ref 98–110)
Creat: 0.76 mg/dL (ref 0.50–1.10)
GFR, EST NON AFRICAN AMERICAN: 92 mL/min/{1.73_m2} (ref >=60)
GFR, Est African American: 107 mL/min/{1.73_m2} (ref >=60)
GLUCOSE: 158 mg/dL — AB (ref 65–99)
Globulin: 3.8 g/dL (calc) — ABNORMAL HIGH (ref 1.9–3.7)
Potassium: 4.3 mmol/L (ref 3.5–5.3)
Sodium: 137 mmol/L (ref 135–146)
Total Bilirubin: 0.4 mg/dL (ref 0.2–1.2)
Total Protein: 7.1 g/dL (ref 6.1–8.1)

## 2018-06-22 ENCOUNTER — Telehealth: Payer: Self-pay

## 2018-06-22 ENCOUNTER — Telehealth: Payer: Self-pay | Admitting: Internal Medicine

## 2018-06-22 NOTE — Telephone Encounter (Signed)
Please see other result note from today, phone note was closed in error before completion:     ----- Message from Carlus Pavlovristina Gherghe, MD sent at 06/21/2018 11:52 AM EST ----- Efraim KaufmannMelissa, can you please call pt: urine proteins, kidney, liver tests and also lipids - all at goal. Slightly high Glu and slightly low albumin, poss. 2/2 her h/o gastric bypass..Marland Kitchen

## 2018-06-22 NOTE — Telephone Encounter (Signed)
-----   Message from Carlus Pavlov, MD sent at 06/21/2018 11:52 AM EST ----- Efraim Kaufmann, can you please call pt: urine proteins, kidney, liver tests and also lipids - all at goal. Slightly high Glu and slightly low albumin, poss. 2/2 her h/o gastric bypass.Marland Kitchen

## 2018-06-22 NOTE — Telephone Encounter (Signed)
LMTCB

## 2018-06-22 NOTE — Telephone Encounter (Signed)
Patient stated she is returning a call from the office about her lab results.

## 2018-06-23 NOTE — Telephone Encounter (Signed)
Notified patient of message from Dr. Gherghe, patient expressed understanding and agreement. No further questions.  

## 2018-06-23 NOTE — Telephone Encounter (Signed)
Patient is calling inquiring about lab results. Please Advise, thanks

## 2018-07-01 IMAGING — CT CT ABD-PELV W/ CM
3 of 10 series · 11 of 46 positions shown, 17 images · IV contrast (ISOVUE 370)
Comparison: Chest radiographs obtained earlier today.

CLINICAL DATA: Status post laparoscopic Roux-en-Y gastric bypass
with hiatal hernia repair on 05/09/2016. Purulent discharge from the
surgical incision. Chest pain and dyspnea on exertion.



[Series 5: abd/pel with · axial · 0.89mm/px · z∈[-645,-275]mm · 6 of 104 slices shown, 11 images]
[im 15/104  soft-tissue]
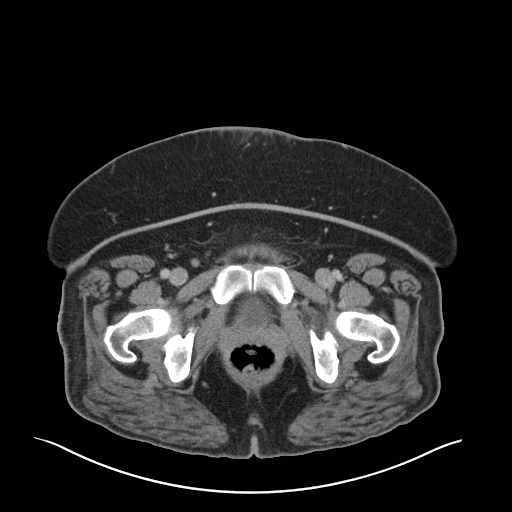
[im 15/104  bone]
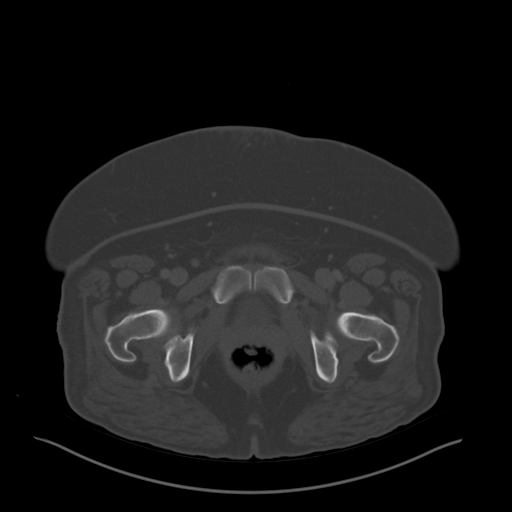
[im 30/104  soft-tissue]
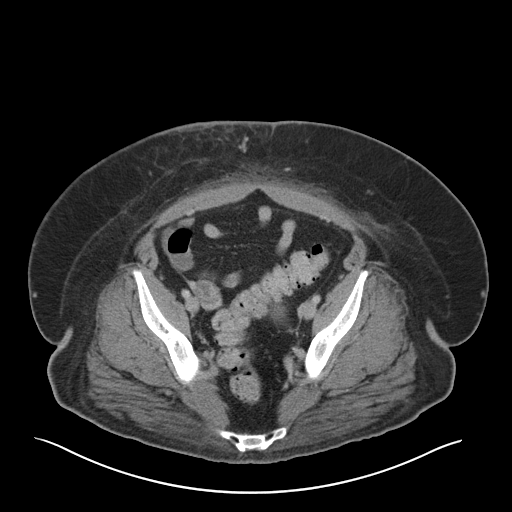
[im 45/104  soft-tissue]
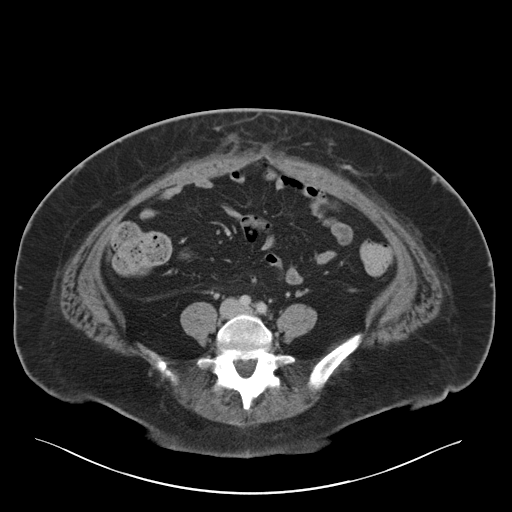
[im 45/104  lung]
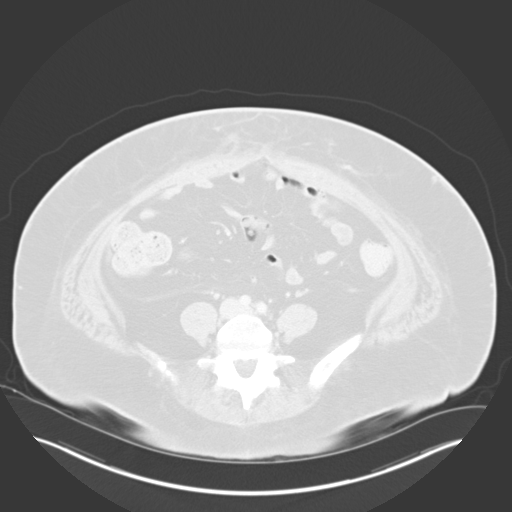
[im 59/104  soft-tissue]
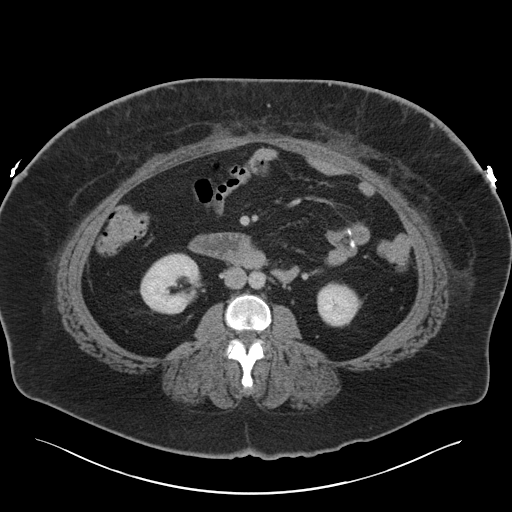
[im 59/104  lung]
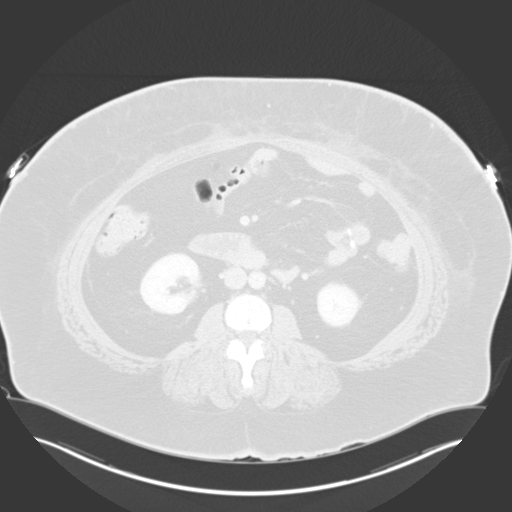
[im 74/104  soft-tissue]
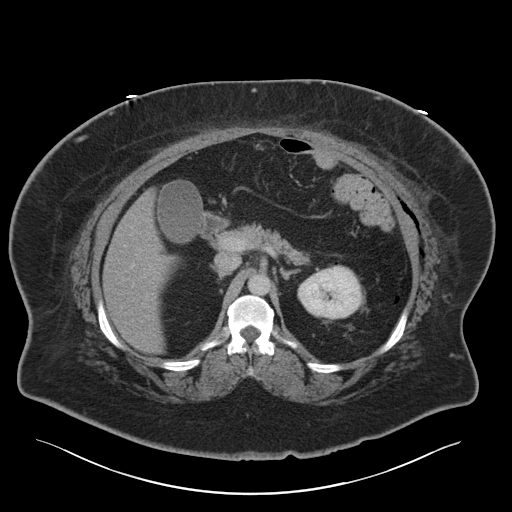
[im 74/104  lung]
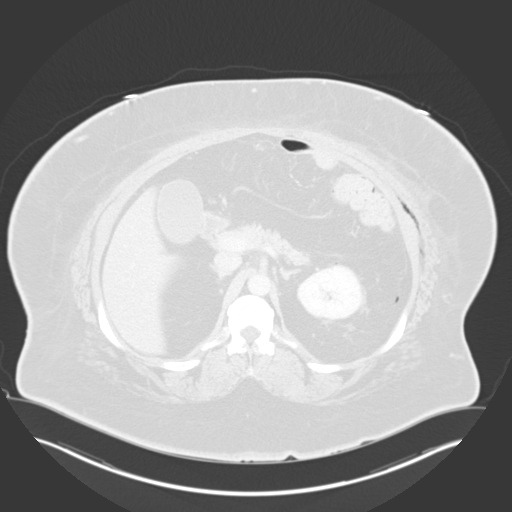
[im 89/104  soft-tissue]
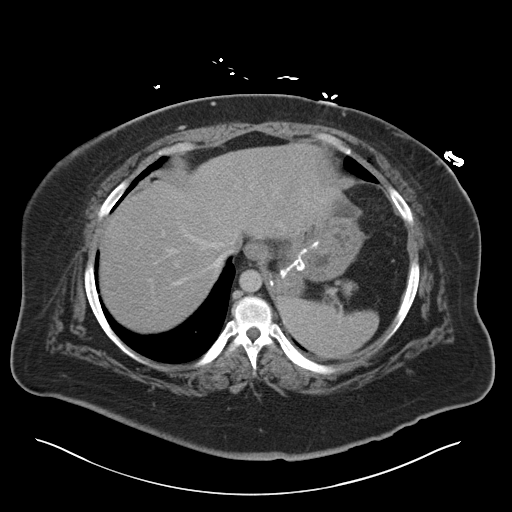
[im 89/104  lung]
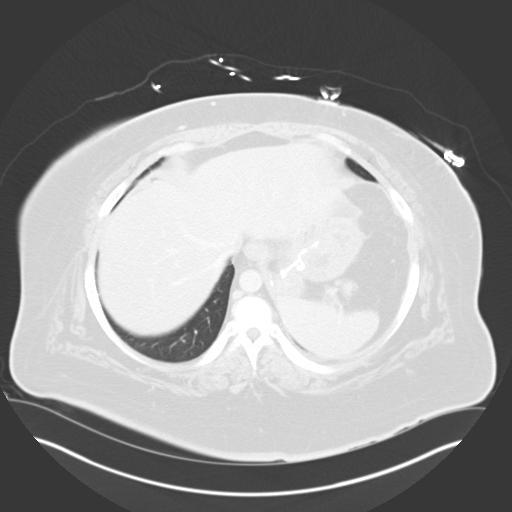

[Series 12: thins for pacs · axial · 0.73mm/px · z∈[-293,-234]mm · 3 of 220 slices shown]
[im 15/220  soft-tissue]
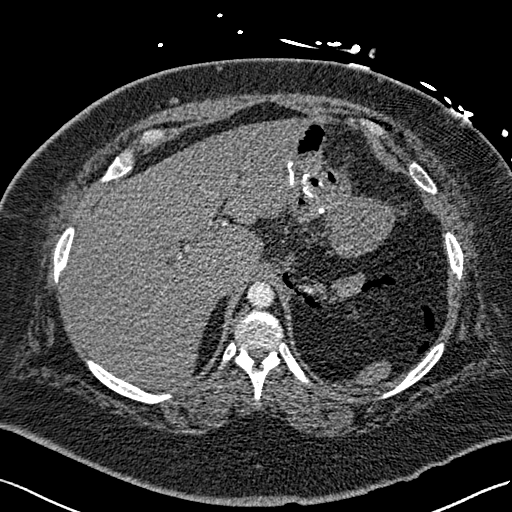
[im 44/220  soft-tissue]
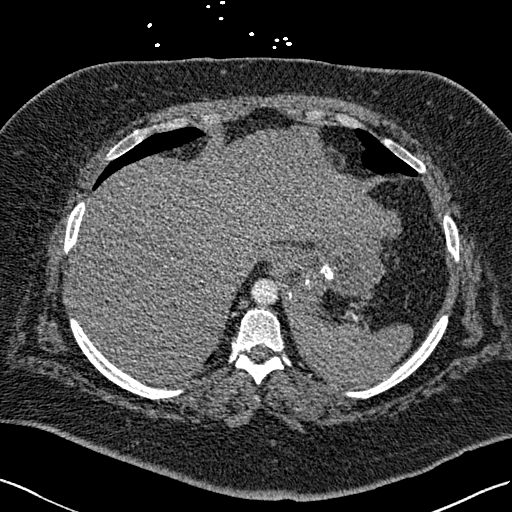
[im 74/220  soft-tissue]
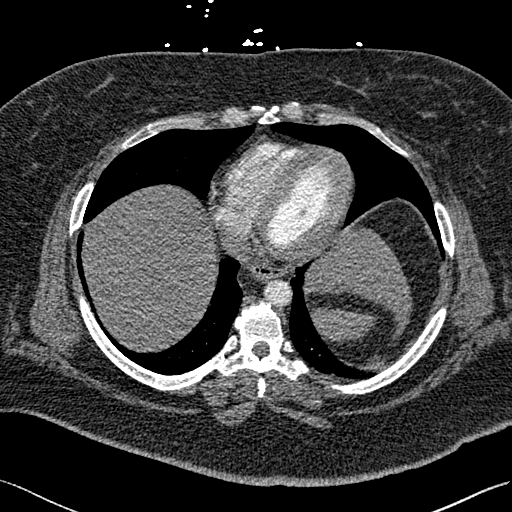

[Series 14: coronal · coronal · 0.74mm/px · 2 of 191 slices shown, 3 images]
[im 64/191  soft-tissue]
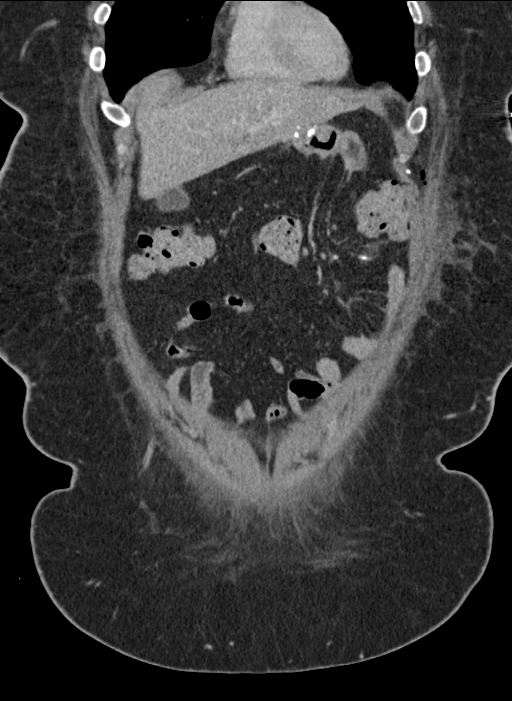
[im 64/191  bone]
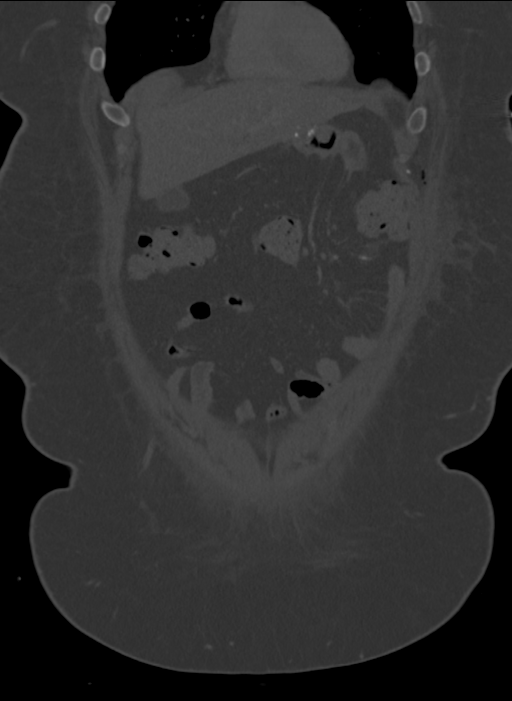
[im 127/191  soft-tissue]
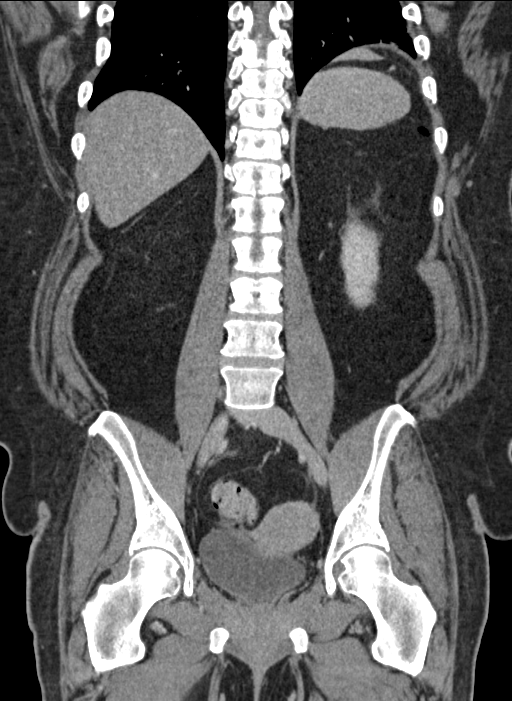

[11 of 46 positions shown; findings below may reference images not displayed]

FINDINGS: CTA CHEST FINDINGS

Cardiovascular: Satisfactory opacification of the pulmonary arteries
to the segmental level. No evidence of pulmonary embolism. Normal
heart size. No pericardial effusion.

Mediastinum/Nodes: No enlarged mediastinal, hilar, or axillary lymph
nodes. Thyroid gland, trachea, and esophagus demonstrate no
significant findings.

Lungs/Pleura: Left lower lobe and lingular linear atelectasis.

Musculoskeletal: Thoracic spine degenerative changes.

Review of the MIP images confirms the above findings.

CT ABDOMEN and PELVIS FINDINGS

Hepatobiliary: Mild diffuse low density of the liver relative to the
spleen. Normal appearing gallbladder.

Pancreas: Unremarkable. No pancreatic ductal dilatation or
surrounding inflammatory changes.

Spleen: Normal in size without focal abnormality.

Adrenals/Urinary Tract: Small lower pole left renal cyst. Small
upper pole left renal calculus, measuring 4 mm in maximum diameter.
Normal appearing adrenal glands, right kidney, ureters and urinary
bladder.

Stomach/Bowel: Post gastric bypass changes in the upper abdomen. No
bowel dilatation. No evidence of appendicitis. Minimal sigmoid colon
diverticulosis.

Vascular/Lymphatic: No significant vascular findings are present. No
enlarged abdominal or pelvic lymph nodes.

Reproductive: Uterus and bilateral adnexa are unremarkable.

Other: Small to moderate amount of free peritoneal air in the left
upper abdomen in the region of recent surgery. There is also some
postoperative air beneath the left anterolateral abdominal wall
musculature. There is mild subcutaneous edema anteriorly on both
sides, greater on the left. No discrete fluid collections suspicious
for abscess are seen.

Musculoskeletal: Lumbar spine degenerative changes.

Review of the MIP images confirms the above findings.
IMPRESSION: 1. No pulmonary emboli.
2. Upper abdominal postsurgical changes without abscess.
3. Mild diffuse hepatic steatosis.
4. 4 mm nonobstructing left renal calculus.

## 2018-09-24 ENCOUNTER — Other Ambulatory Visit: Payer: Self-pay

## 2018-09-24 ENCOUNTER — Ambulatory Visit (INDEPENDENT_AMBULATORY_CARE_PROVIDER_SITE_OTHER): Payer: BLUE CROSS/BLUE SHIELD | Admitting: Internal Medicine

## 2018-09-24 ENCOUNTER — Encounter: Payer: Self-pay | Admitting: Internal Medicine

## 2018-09-24 DIAGNOSIS — E1165 Type 2 diabetes mellitus with hyperglycemia: Secondary | ICD-10-CM | POA: Diagnosis not present

## 2018-09-24 DIAGNOSIS — E669 Obesity, unspecified: Secondary | ICD-10-CM

## 2018-09-24 DIAGNOSIS — E785 Hyperlipidemia, unspecified: Secondary | ICD-10-CM | POA: Diagnosis not present

## 2018-09-24 MED ORDER — SEMAGLUTIDE(0.25 OR 0.5MG/DOS) 2 MG/1.5ML ~~LOC~~ SOPN: 0.5000 mg | PEN_INJECTOR | SUBCUTANEOUS | 5 refills | Status: DC

## 2018-09-24 NOTE — Patient Instructions (Addendum)
Please stop Januvia and start: - Ozempic 0.25 mg weekly and increase to 0.5 mg if tolerated.  Please come back for a follow-up appointment in 3 months.

## 2018-09-24 NOTE — Progress Notes (Signed)
Patient ID: Rachel Stewart, female   DOB: 07/02/1970, 48 y.o.   MRN: [id number]   Patient location: Home My location: Office  Referring Provider: Dr. Luretha MurphyMatthew Martin  I connected with the patient on 09/24/18 at 9:38 AM EDT by a video enabled telemedicine application and verified that I am speaking with the correct person.   I discussed the limitations of evaluation and management by telemedicine and the availability of in person appointments. The patient expressed understanding and agreed to proceed.   Details of the encounter are shown below.  HPI: Rachel Stewart is a 48 y.o.-year-old female, initially referred by her bariatric surgeon, Dr. Luretha MurphyMatthew Martin, returning for follow-up for DM2, dx in 2005, non-insulin-dependent, controlled, without long term complications.  Last visit 3 months ago.  Reviewed history: Patient has a history of Roux-en-Y gastric bypass surgery in 2017.  She initially lost approximately 80 pounds and her diabetes improved significantly since then and she was able to come off all of her medicines.    However, HbA1c levels increased afterwards so we had to start the GLP-1 receptor agonist at last visit.  Last hemoglobin A1c was: Lab Results  Component Value Date   HGBA1C 6.1 (A) 06/18/2018   HGBA1C 8.1 (A) 03/12/2018   HGBA1C 7.2 (A) 11/17/2017  07/15/2017: HbA1c 6.6%  She was previously on Ozempic but developed nausea and we switched to Januvia 100 mg at last visit.  She refused metformin due to the large pills.  Before her gastric bypass surgery she was on:  Metformin  Victoza - few doses    Jentadueto XL  JanuMet XR  Glimepiride  She was not checking sugars at last visit and still not checking... - am: n/c - 2h after b'fast: n/c - before lunch: n/c - 2h after lunch: n/c - before dinner: n/c - 2h after dinner: n/c - bedtime: n/c - nighttime: n/c  Glucometer: One Touch Ultra Mini  Pt's meals are: - Breakfast: eggs + bacon  -  Lunch: wrap - Dinner: Lasagna, soup - Snacks: nuts, sugar-free popsicles  Works 3rd shift  -gets off at 7 am.  -No CKD, last BUN/creatinine:   Lab Results  Component Value Date   BUN 12 06/18/2018   BUN 11 05/25/2016   CREATININE 0.76 06/18/2018   CREATININE 0.58 05/25/2016  07/15/2017: 16/0.69 Not on an ACE inhibitor/ARB  -+ HL; Last set of lipids: Lab Results  Component Value Date   CHOL 139 06/18/2018   HDL 39.60 06/18/2018   LDLCALC 74 06/18/2018   TRIG 127.0 06/18/2018   CHOLHDL 4 06/18/2018  07/15/2017: 155/156/45/85  She is not on a statin.  - last eye exam was in 03/2017: Probably + DR: "I had bleeding behind my eye".  -She denies numbness and tingling in her feet.  Pt has FH of DM in M and 3 sisters.  She has HTN, OSA-not compliant with CPAP.  She also has anemia and takes iron.  On multivitamins, B12 vitamin, calcium 3 times a day  ROS: Constitutional: + weight gain/no weight loss, no fatigue, no subjective hyperthermia, no subjective hypothermia Eyes: no blurry vision, no xerophthalmia ENT: no sore throat, no nodules palpated in neck, no dysphagia, no odynophagia, no hoarseness Cardiovascular: no CP/no SOB/no palpitations/no leg swelling Respiratory: no cough/no SOB/no wheezing Gastrointestinal: + N/no V/no D/no C/no acid reflux Musculoskeletal: no muscle aches/no joint aches Skin: no rashes, no hair loss Neurological: no tremors/no numbness/no tingling/no dizziness  I reviewed pt's medications, allergies, PMH, social hx, family  hx, and changes were documented in the history of present illness. Otherwise, unchanged from my initial visit note.  Past Medical History:  Diagnosis Date  . Bronchitis, chronic (HCC)    last tx. 8'17  . Diabetes mellitus without complication (HCC)   . GERD (gastroesophageal reflux disease)   . Hypertension   . Neuromuscular disorder (HCC)    "tic pain" left face near ear area. Carpal tunnel bilateral. Arthritis knee.  .  Sleep apnea   . Ventral hernia    at present   Past Surgical History:  Procedure Laterality Date  . BREAST SURGERY     breast reduction  . 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 SPINE SURGERY     lumbar decompression   Social History   Socioeconomic History  . Marital status: Single    Spouse name: Not on file  . Number of children: No  Social Needs  Occupational History  . N/A  Tobacco Use  . Smoking status: Never Smoker  . Smokeless tobacco: Never Used  Substance and Sexual Activity  . Alcohol use: No  . Drug use: No   Current Outpatient Medications on File Prior to Visit  Medication Sig Dispense Refill  . glucose blood test strip Use as instructed  -1x a day -One Touch Ultra 100 each 12  . hydrOXYzine (ATARAX/VISTARIL) 10 MG tablet Take 10 mg by mouth 3 (three) times daily as needed.    . medroxyPROGESTERone (PROVERA) 10 MG tablet Take 20 mg by mouth daily.  0  . Multiple Vitamin (MULTIVITAMIN WITH MINERALS) TABS tablet Take 1 tablet by mouth daily.    . sitaGLIPtin (JANUVIA) 100 MG tablet Take 1 tablet (100 mg total) by mouth daily. 90 tablet 3  . tiZANidine (ZANAFLEX) 2 MG tablet TAKE 1 TABLET BY MOUTH EVERY 12 HOURS AS NEEDED FOR MUSCLE/JOINT PAIN  1   No current facility-administered medications on file prior to visit.    Allergies  Allergen Reactions  . Lisinopril Cough   Family History  Problem Relation Age of Onset  . Aneurysm Mother   . Hypertension Sister    PE: There were no vitals taken for this visit. Wt Readings from Last 3 Encounters:  06/18/18 199 lb (90.3 kg)  04/16/18 200 lb 11.2 oz (91 kg)  03/12/18 201 lb (91.2 kg)   Constitutional:  in NAD  The physical exam was not performed (virtual visit).  ASSESSMENT: 1. DM2,  non-insulin-dependent, controlled, without long term complications  2. Obesity   3.  HL  PLAN:  1. Patient with longstanding, previously diet-controlled diabetes after her gastric bypass surgery, after which she lost a significant amount of weight and she was able to come off all of her diabetes medicines.  However, after this, her HbA1c was higher, at 8.1% and I suggested metformin but she could not swallow large pills so we ended up starting a GLP-1 receptor agonist.  She had some nausea at last visit and we decided to stop Ozempic and try Januvia.   - She is still not checking sugars so it is unclear how her diabetes control has changed.  Her HbA1c at last visit was 6.1%, much better.  -She tells me at this visit that her appetite is increased after stopping Ozempic and she would like to retry it.  We will start at a low dose and advance as tolerated. -I strongly advised her to restart checking sugars - I suggested to:   Patient Instructions  Please stop Januvia and start: - Ozempic 0.25 mg weekly and increase to 0.5 mg if tolerated.  Please come back for a follow-up appointment in 3 months.  - continue checking sugars at different times of the day - check 1x a day, rotating checks - advised for yearly eye exams >> she is UTD - Return to clinic in 3 mo with sugar log     2.Obesity  -She is status post gastric bypass in 05/2016, after which she lost approximately 80 pounds and then plateaued -At last visit we had to stop Ozempic since this was causing nausea and we switched to Januvia, which is weight neutral - gained ~10 lbs since last visit and she is interested in restarting Ozempic.  I sent this to her pharmacy - we will start at a low dose and advance as tolerated.  3. HL - Reviewed latest lipid panel from 06/2018: All fractions at goal Lab Results  Component Value Date   CHOL 139 06/18/2018   HDL 39.60 06/18/2018   LDLCALC 74 06/18/2018   TRIG 127.0 06/18/2018   CHOLHDL 4  06/18/2018  -She is not on a statin  Carlus Pavlov, MD PhD [facility] Endocrin[facility]

## 2019-05-24 ENCOUNTER — Other Ambulatory Visit: Payer: Self-pay | Admitting: Internal Medicine

## 2019-08-15 ENCOUNTER — Encounter: Payer: Self-pay | Admitting: "Endocrinology

## 2019-08-15 LAB — BASIC METABOLIC PANEL
BUN: 13 (ref 4–21)
Creatinine: 0.8 (ref 0.5–1.1)

## 2019-08-15 LAB — VITAMIN D 25 HYDROXY (VIT D DEFICIENCY, FRACTURES): Vit D, 25-Hydroxy: 25

## 2019-08-15 LAB — HEMOGLOBIN A1C: Hemoglobin A1C: 8.4

## 2019-08-15 LAB — LIPID PANEL
Cholesterol: 188 (ref 0–200)
HDL: 51 (ref 35–70)
LDL Cholesterol: 96
Triglycerides: 204 — AB (ref 40–160)

## 2019-08-15 LAB — COMPREHENSIVE METABOLIC PANEL
GFR calc Af Amer: >60
GFR calc non Af Amer: >60

## 2019-08-15 LAB — TSH: TSH: 0.93 (ref 0.41–5.90)

## 2019-11-11 ENCOUNTER — Ambulatory Visit (HOSPITAL_COMMUNITY)
Admission: RE | Admit: 2019-11-11 | Discharge: 2019-11-11 | Disposition: A | Discharge status: Home / Self Care | Payer: BC Managed Care – PPO | Source: Ambulatory Visit | Attending: Cardiovascular Disease | Admitting: Cardiovascular Disease

## 2019-11-11 ENCOUNTER — Other Ambulatory Visit (HOSPITAL_COMMUNITY): Payer: Self-pay | Admitting: Orthopedic Surgery

## 2019-11-11 ENCOUNTER — Other Ambulatory Visit: Payer: Self-pay

## 2019-11-11 DIAGNOSIS — M79605 Pain in left leg: Secondary | ICD-10-CM | POA: Diagnosis present

## 2019-11-11 DIAGNOSIS — M79662 Pain in left lower leg: Secondary | ICD-10-CM

## 2019-11-11 DIAGNOSIS — M7989 Other specified soft tissue disorders: Secondary | ICD-10-CM

## 2019-11-11 DIAGNOSIS — M79661 Pain in right lower leg: Secondary | ICD-10-CM

## 2019-11-11 DIAGNOSIS — M79604 Pain in right leg: Secondary | ICD-10-CM | POA: Diagnosis present

## 2020-01-19 ENCOUNTER — Other Ambulatory Visit: Payer: Self-pay

## 2020-01-19 ENCOUNTER — Ambulatory Visit: Payer: BC Managed Care – PPO | Admitting: "Endocrinology

## 2020-01-19 ENCOUNTER — Encounter: Payer: Self-pay | Admitting: "Endocrinology

## 2020-01-19 VITALS — BP 130/80 | HR 104 | Ht 64.0 in | Wt 205.6 lb

## 2020-01-19 DIAGNOSIS — E119 Type 2 diabetes mellitus without complications: Secondary | ICD-10-CM | POA: Diagnosis not present

## 2020-01-19 DIAGNOSIS — I10 Essential (primary) hypertension: Secondary | ICD-10-CM | POA: Diagnosis not present

## 2020-01-19 LAB — POCT GLYCOSYLATED HEMOGLOBIN (HGB A1C): Hemoglobin A1C: 8 % — AB (ref 4.0–5.6)

## 2020-01-19 MED ORDER — GLIPIZIDE ER 5 MG PO TB24: 5.0000 mg | ORAL_TABLET | Freq: Every day | ORAL | 3 refills | Status: DC

## 2020-01-19 NOTE — Patient Instructions (Signed)

## 2020-01-19 NOTE — Progress Notes (Signed)
Endocrinology Consult Note       01/19/2020, 2:19 PM   Subjective:    Patient ID: Rachel Stewart, female    DOB: 05/30/1971.  Rachel Stewart is being seen in consultation for management of currently uncontrolled symptomatic diabetes requested by  Floydene FlockJones, Viola, NP.   Past Medical History:  Diagnosis Date  . Bronchitis, chronic (HCC)    last tx. 8'17  . Diabetes mellitus without complication (HCC)   . GERD (gastroesophageal reflux disease)   . Hypertension   . Neuromuscular disorder (HCC)    "tic pain" left face near ear area. Carpal tunnel bilateral. Arthritis knee.  . Sleep apnea   . Ventral hernia    at present    Past Surgical History:  Procedure Laterality Date  . BREAST SURGERY     breast reduction  . 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 SPINE SURGERY     lumbar decompression    Social History   Socioeconomic History  . Marital status: Single    Spouse name: Not on file  . Number of children: Not on file  . Years of education: Not on file  . Highest education level: Not on file  Occupational History  . Not on file  Tobacco Use  . Smoking status: Never Smoker  . Smokeless tobacco: Never Used  Vaping Use  . Vaping Use: Never used  Substance and Sexual Activity  . Alcohol use: No  . Drug use: No  . Sexual activity: Yes  Other Topics Concern  . Not on file  Social History Narrative  . Not on file   Social Determinants of Health   Financial Resource Strain:   . Difficulty of Paying Living Expenses:   Food Insecurity:   . Worried About Programme researcher, broadcasting/film/videounning Out of Food in the Last Year:   . Baristaan Out of Food in the Last Year:   Transportation Needs:   . Automotive engineerLack of  Transportation (Medical):   Marland Kitchen. Lack of Transportation (Non-Medical):   Physical Activity:   . Days of Exercise per Week:   . Minutes of Exercise per Session:   Stress:   . Feeling of Stress :   Social Connections:   . Frequency of Communication with Friends and Family:   . Frequency of Social Gatherings with Friends and Family:   . Attends Religious Services:   . Active Member of Clubs or Organizations:   . Attends BankerClub or Organization Meetings:   Marland Kitchen. Marital Status:     Family History  Problem Relation Age of Onset  . Aneurysm Mother   . Diabetes Mother   . Hypertension Sister     Outpatient Encounter Medications as of 01/19/2020  Medication Sig  . vitamin B-12 (CYANOCOBALAMIN) 500 MCG tablet Take 500 mcg by mouth daily.  . [DISCONTINUED] APPLE CIDER VINEGAR PO Take by mouth.  . FERRETTS 325 (106 Fe) MG TABS tablet Take 1 tablet by mouth daily.  .Marland Kitchen  glipiZIDE (GLUCOTROL XL) 5 MG 24 hr tablet Take 1 tablet (5 mg total) by mouth daily with breakfast.  . glucose blood test strip Use as instructed  -1x a day -One Touch Ultra  . hydrOXYzine (ATARAX/VISTARIL) 10 MG tablet Take 10 mg by mouth 3 (three) times daily as needed.  Marland Kitchen losartan (COZAAR) 50 MG tablet Take 50 mg by mouth daily.  . Multiple Vitamin (MULTIVITAMIN WITH MINERALS) TABS tablet Take 1 tablet by mouth daily.  Marland Kitchen SAXENDA 18 MG/3ML SOPN Inject 3 mg into the skin daily.  Marland Kitchen tiZANidine (ZANAFLEX) 2 MG tablet TAKE 1 TABLET BY MOUTH EVERY 12 HOURS AS NEEDED FOR MUSCLE/JOINT PAIN  . [DISCONTINUED] medroxyPROGESTERone (PROVERA) 10 MG tablet Take 20 mg by mouth daily.  . [DISCONTINUED] OZEMPIC, 0.25 OR 0.5 MG/DOSE, 2 MG/1.5ML SOPN INJECT 0.5 MG INTO THE SKIN ONCE A WEEK.   No facility-administered encounter medications on file as of 01/19/2020.    ALLERGIES: Allergies  Allergen Reactions  . Lisinopril Cough    VACCINATION STATUS:  There is no immunization history on file for this patient.  Diabetes She presents for her  initial diabetic visit. She has type 2 diabetes mellitus. Onset time: She was diagnosed at approximate age of 33 years. Her disease course has been fluctuating. There are no hypoglycemic associated symptoms. Pertinent negatives for hypoglycemia include no confusion, headaches, pallor or seizures. Associated symptoms include fatigue, polydipsia and polyuria. Pertinent negatives for diabetes include no chest pain and no polyphagia. There are no hypoglycemic complications. Symptoms are worsening. There are no diabetic complications. Risk factors for coronary artery disease include diabetes mellitus, hypertension, obesity and sedentary lifestyle. Current diabetic treatments: She is currently on Ozempic 0.25 mg subcutaneously weekly, Saxenda 3 mg daily. Her weight is fluctuating minimally (Patient with history of moderate obesity, weight up to 275 pounds before she underwent Roux-en-Y gastric bypass in 2006 which allowed her to lose more than 100 pounds, however, more recently she is gaining.). She is following a generally unhealthy diet. When asked about meal planning, she reported none. She has not had a previous visit with a dietitian. She participates in exercise intermittently. (She did not bring any logs nor meter to review today.  Her point-of-care A1c was 8%, 8.4% in March 2021.) An ACE inhibitor/angiotensin II receptor blocker is being taken. Eye exam is current.  Hypertension This is a chronic problem. The current episode started more than 1 year ago. The problem is controlled. Pertinent negatives include no chest pain, headaches, palpitations or shortness of breath. Risk factors for coronary artery disease include diabetes mellitus, sedentary lifestyle and obesity. Past treatments include angiotensin blockers.     Review of Systems  Constitutional: Positive for fatigue. Negative for chills, fever and unexpected weight change.  HENT: Negative for trouble swallowing and voice change.   Eyes: Negative  for visual disturbance.  Respiratory: Negative for cough, shortness of breath and wheezing.   Cardiovascular: Negative for chest pain, palpitations and leg swelling.  Gastrointestinal: Negative for diarrhea, nausea and vomiting.  Endocrine: Positive for polydipsia and polyuria. Negative for cold intolerance, heat intolerance and polyphagia.  Musculoskeletal: Negative for arthralgias and myalgias.  Skin: Negative for color change, pallor, rash and wound.  Neurological: Negative for seizures and headaches.  Psychiatric/Behavioral: Negative for confusion and suicidal ideas.    Objective:    Vitals with BMI 01/19/2020 06/18/2018 04/16/2018  Height 5\' 4"  5' 4.5" 5\' 6"   Weight 205 lbs 10 oz 199 lbs 200 lbs 11 oz  BMI 35.27  33.64 32.41  Systolic 130 130 -  Diastolic 80 70 -  Pulse 104 96 -    BP 130/80   Pulse (!) 104   Ht 5\' 4"  (1.626 m)   Wt 205 lb 9.6 oz (93.3 kg)   BMI 35.29 kg/m   Wt Readings from Last 3 Encounters:  01/19/20 205 lb 9.6 oz (93.3 kg)  06/18/18 199 lb (90.3 kg)  04/16/18 200 lb 11.2 oz (91 kg)     Physical Exam Constitutional:      Appearance: She is well-developed.  HENT:     Head: Normocephalic and atraumatic.  Neck:     Thyroid: No thyromegaly.     Trachea: No tracheal deviation.  Cardiovascular:     Rate and Rhythm: Normal rate and regular rhythm.  Pulmonary:     Effort: Pulmonary effort is normal.  Abdominal:     Tenderness: There is no abdominal tenderness. There is no guarding.  Musculoskeletal:        General: Normal range of motion.     Cervical back: Normal range of motion and neck supple.     Comments: Normal foot exam.  Skin:    General: Skin is warm and dry.     Coloration: Skin is not pale.     Findings: No erythema or rash.  Neurological:     Mental Status: She is alert and oriented to person, place, and time.     Cranial Nerves: No cranial nerve deficit.     Coordination: Coordination normal.     Deep Tendon Reflexes: Reflexes are  normal and symmetric.  Psychiatric:        Judgment: Judgment normal.       CMP ( most recent) CMP     Component Value Date/Time   NA 137 06/18/2018 1608   K 4.3 06/18/2018 1608   CL 108 06/18/2018 1608   CO2 23 06/18/2018 1608   GLUCOSE 158 (H) 06/18/2018 1608   BUN 13 08/15/2019 0000   CREATININE 0.8 08/15/2019 0000   CREATININE 0.76 06/18/2018 1608   CALCIUM 8.5 (L) 06/18/2018 1608   PROT 7.1 06/18/2018 1608   ALBUMIN 3.5 05/14/2016 1408   AST 13 06/18/2018 1608   ALT 10 06/18/2018 1608   ALKPHOS 71 05/14/2016 1408   BILITOT 0.4 06/18/2018 1608   GFRNONAA >60 08/15/2019 0000   GFRNONAA 92 06/18/2018 1608   GFRAA >60 08/15/2019 0000   GFRAA 107 06/18/2018 1608     Diabetic Labs (most recent): Lab Results  Component Value Date   HGBA1C 8.0 (A) 01/19/2020   HGBA1C 8.4 08/15/2019   HGBA1C 6.1 (A) 06/18/2018     Lipid Panel ( most recent) Lipid Panel     Component Value Date/Time   CHOL 188 08/15/2019 0000   TRIG 204 (A) 08/15/2019 0000   HDL 51 08/15/2019 0000   CHOLHDL 4 06/18/2018 1608   VLDL 25.4 06/18/2018 1608   LDLCALC 96 08/15/2019 0000      Lab Results  Component Value Date   TSH 0.93 08/15/2019           Assessment & Plan:   1. Type 2 diabetes mellitus without complication, without long-term current use of insulin (HCC)  - Rachel Stewart has currently uncontrolled symptomatic type 2 DM since  49 years of age,  with most recent A1c of 8 %. Recent labs reviewed. - I had a long discussion with her about the progressive nature of diabetes and the pathology behind its complications. -  her diabetes is complicated by obesity and she remains at a high risk for more acute and chronic complications which include CAD, CVA, CKD, retinopathy, and neuropathy. These are all discussed in detail with her.  - I have counseled her on diet  and weight management  by adopting a carbohydrate restricted/protein rich diet. Patient is encouraged to switch to   unprocessed or minimally processed     complex starch and increased protein intake (animal or plant source), fruits, and vegetables. -  she is advised to stick to a routine mealtimes to eat 3 meals  a day and avoid unnecessary snacks ( to snack only to correct hypoglycemia).   - she admits that there is a room for improvement in her food and drink choices. - Suggestion is made for her to avoid simple carbohydrates  from her diet including Cakes, Sweet Desserts, Ice Cream, Soda (diet and regular), Sweet Tea, Candies, Chips, Cookies, Store Bought Juices, Alcohol in Excess of  1-2 drinks a day, Artificial Sweeteners,  Coffee Creamer, and "Sugar-free" Products. This will help patient to have more stable blood glucose profile and potentially avoid unintended weight gain.  - she will be scheduled with Norm Salt, RDN, CDE for diabetes education.  - I have approached her with the following individualized plan to manage  her diabetes and patient agrees:   - she will not need insulin treatment for now.  However, she is on a duplicate incretin therapy.  She is advised to discontinue Ozempic, continue Saxenda 2 mg subcutaneously daily.  -She does not tolerate Metformin.  She will benefit from low-dose glipizide.  I discussed and added glipizide 5 mg XL p.o. daily at breakfast.  -She is willing to start monitoring blood glucose twice a day-daily before breakfast and at bedtime and return with her meter and logs during her next visit.  - she is encouraged to call clinic for blood glucose levels less than 70 or above 200 mg /dl.  - Specific targets for  A1c;  LDL, HDL,  and Triglycerides were discussed with the patient.  2) Blood Pressure /Hypertension:  her blood pressure is  controlled to target.   she is advised to continue her current medications including losartan 50 mg p.o. daily with breakfast . 3) Lipids/Hyperlipidemia:   Review of her recent lipid panel showed un controlled  LDL at 96 .  she  is on  statins.  She will be considered for low-dose statin intervention next visit.    4)  Weight/Diet:  Body mass index is 35.29 kg/m.  -   clearly complicating her diabetes care.   she is  a candidate for weight loss. I discussed with her the fact that loss of 5 - 10% of her  current body weight will have the most impact on her diabetes management.  Exercise, and detailed carbohydrates information provided  -  detailed on discharge instructions.  Patient has lost more than 100 pounds with her to her last gastric bypass, currently retaining.  5) Chronic Care/Health Maintenance:  -she  is on ARB medications and  is encouraged to initiate and continue to follow up with Ophthalmology, Dentist,  Podiatrist at least yearly or according to recommendations, and advised to   stay away from smoking. I have recommended yearly flu vaccine and pneumonia vaccine at least every 5 years; moderate intensity exercise for up to 150 minutes weekly; and  sleep for at least 7 hours a day.  - she is  advised to maintain close  follow up with Floydene Flock, NP for primary care needs, as well as her other providers for optimal and coordinated care.   - Time spent in this patient care: 60 min, of which > 50% was spent in  counseling  her about her currently uncontrolled type 2 diabetes, hypertension, obesity/sedentary life and the rest reviewing her blood glucose logs , discussing her hypoglycemia and hyperglycemia episodes, reviewing her current and  previous labs / studies  ( including abstraction from other facilities) and medications  doses and developing a  long term treatment plan based on the latest standards of care/ guidelines; and documenting her care.    Please refer to Patient Instructions for Blood Glucose Monitoring and Insulin/Medications Dosing Guide"  in media tab for additional information. Please  also refer to " Patient Self Inventory" in the Media  tab for reviewed elements of pertinent patient  history.  Rachel Stewart participated in the discussions, expressed understanding, and voiced agreement with the above plans.  All questions were answered to her satisfaction. she is encouraged to contact clinic should she have any questions or concerns prior to her return visit.   Follow up plan: - Return in about 3 months (around 04/20/2020) for F/U with Pre-visit Labs, Meter, Logs, A1c here.Marquis Lunch, MD [facility] [address] [place], [place] [address] Phone: [phone]  Fax: [phone]    01/19/2020, 2:19 PM  This note was partially dictated with voice recognition software. Similar sounding words can be transcribed inadequately or may not  be corrected upon review.

## 2020-04-23 ENCOUNTER — Encounter: Payer: Self-pay | Admitting: "Endocrinology

## 2020-04-23 ENCOUNTER — Ambulatory Visit (INDEPENDENT_AMBULATORY_CARE_PROVIDER_SITE_OTHER): Payer: BC Managed Care – PPO | Admitting: "Endocrinology

## 2020-04-23 ENCOUNTER — Other Ambulatory Visit: Payer: Self-pay

## 2020-04-23 VITALS — BP 140/104 | HR 84 | Ht 64.0 in | Wt 208.8 lb

## 2020-04-23 DIAGNOSIS — E782 Mixed hyperlipidemia: Secondary | ICD-10-CM | POA: Diagnosis not present

## 2020-04-23 DIAGNOSIS — I10 Essential (primary) hypertension: Secondary | ICD-10-CM

## 2020-04-23 DIAGNOSIS — E119 Type 2 diabetes mellitus without complications: Secondary | ICD-10-CM | POA: Diagnosis not present

## 2020-04-23 LAB — POCT GLYCOSYLATED HEMOGLOBIN (HGB A1C): Hemoglobin A1C: 7.7 % — AB (ref 4.0–5.6)

## 2020-04-23 MED ORDER — SAXENDA 18 MG/3ML ~~LOC~~ SOPN: 3.0000 mg | PEN_INJECTOR | Freq: Every day | SUBCUTANEOUS | 2 refills | Status: DC

## 2020-04-23 NOTE — Progress Notes (Signed)
04/23/2020, 12:58 PM  Endocrinology follow-up note   Subjective:    Patient ID: Rachel Stewart, female    DOB: [birth year].  Rachel Stewart is being seen in follow-up after she was seen in consultation for management of currently uncontrolled symptomatic diabetes requested by  Floydene Flock, NP.   Past Medical History:  Diagnosis Date  . Bronchitis, chronic (HCC)    last tx. 8'17  . Diabetes mellitus without complication (HCC)   . GERD (gastroesophageal reflux disease)   . Hypertension   . Neuromuscular disorder (HCC)    "tic pain" left face near ear area. Carpal tunnel bilateral. Arthritis knee.  . Sleep apnea   . Ventral hernia    at present    Past Surgical History:  Procedure Laterality Date  . BREAST SURGERY     breast reduction  . 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 SPINE SURGERY     lumbar decompression    Social History   Socioeconomic History  . Marital status: Single    Spouse name: Not on file  . Number of children: Not on file  . Years of education: Not on file  . Highest education level: Not on file  Occupational History  . Not on file  Tobacco Use  . Smoking status: Never Smoker  . Smokeless tobacco: Never Used  Vaping Use  . Vaping Use: Never used  Substance and Sexual Activity  . Alcohol use: No  . Drug use: No  . Sexual activity: Yes  Other Topics Concern  . Not on file  Social History Narrative  . Not on file   Social Determinants of Health   Financial Resource Strain:   . Difficulty of Paying Living Expenses: Not on file  Food Insecurity:   . Worried About Programme researcher, broadcasting/film/video in the Last Year: Not on file  . Ran Out of Food in the Last Year: Not on file  Transportation Needs:   .  Lack of Transportation (Medical): Not on file  . Lack of Transportation (Non-Medical): Not on file  Physical Activity:   . Days of Exercise per Week: Not on file  . Minutes of Exercise per Session: Not on file  Stress:   . Feeling of Stress : Not on file  Social Connections:   . Frequency of Communication with Friends and Family: Not on file  . Frequency of Social Gatherings with Friends and Family: Not on file  . Attends Religious Services: Not on file  . Active Member of Clubs or Organizations: Not on file  . Attends Banker Meetings: Not on file  . Marital Status: Not on file    Family History  Problem Relation Age of Onset  . Aneurysm Mother   . Diabetes Mother   . Hypertension Sister     Outpatient Encounter Medications as of 04/23/2020  Medication Sig  . cyanocobalamin (,VITAMIN B-12,) 1000 MCG/ML injection Inject 1,000 mcg into the muscle every 30 (thirty) days.  Marland Kitchen FERRETTS 325 (106 Fe) MG TABS tablet Take 1 tablet by mouth daily.  Marland Kitchen glipiZIDE (GLUCOTROL XL) 5 MG 24 hr tablet Take 1 tablet (5 mg total) by mouth daily with breakfast.  . glucose blood test strip Use as instructed  -1x a day -  One Touch Ultra  . hydrOXYzine (ATARAX/VISTARIL) 10 MG tablet Take 10 mg by mouth 3 (three) times daily as needed.  Marland Kitchen losartan (COZAAR) 50 MG tablet Take 50 mg by mouth daily.  . Multiple Vitamin (MULTIVITAMIN WITH MINERALS) TABS tablet Take 1 tablet by mouth daily.  Marland Kitchen SAXENDA 18 MG/3ML SOPN Inject 3 mg into the skin daily.  . [DISCONTINUED] SAXENDA 18 MG/3ML SOPN Inject 3 mg into the skin daily.  . [DISCONTINUED] tiZANidine (ZANAFLEX) 2 MG tablet TAKE 1 TABLET BY MOUTH EVERY 12 HOURS AS NEEDED FOR MUSCLE/JOINT PAIN  . [DISCONTINUED] vitamin B-12 (CYANOCOBALAMIN) 500 MCG tablet Take 500 mcg by mouth daily.   No facility-administered encounter medications on file as of 04/23/2020.    ALLERGIES: Allergies  Allergen Reactions  . Lisinopril Cough    VACCINATION  STATUS:  There is no immunization history on file for this patient.  Diabetes She presents for her follow-up diabetic visit. She has type 2 diabetes mellitus. Onset time: She was diagnosed at approximate age of 33 years. Her disease course has been improving. There are no hypoglycemic associated symptoms. Pertinent negatives for hypoglycemia include no confusion, headaches, pallor or seizures. Associated symptoms include fatigue, polydipsia and polyuria. Pertinent negatives for diabetes include no chest pain and no polyphagia. There are no hypoglycemic complications. Symptoms are improving. There are no diabetic complications. Risk factors for coronary artery disease include diabetes mellitus, hypertension, obesity and sedentary lifestyle. Current diabetic treatments: She is currently on Ozempic 0.25 mg subcutaneously weekly, Saxenda 3 mg daily. Her weight is fluctuating minimally (Patient with history of moderate obesity, weight up to 275 pounds before she underwent Roux-en-Y gastric bypass in 2006 which allowed her to lose more than 100 pounds, however, more recently she is gaining.). She is following a generally unhealthy diet. When asked about meal planning, she reported none. She has not had a previous visit with a dietitian. She participates in exercise intermittently. (She admits that she does not monitor blood glucose regularly.  Her point-of-care A1c today was 7.7%, slowly improving from 8.4% in March 2021.  She denies hypoglycemia. ) An ACE inhibitor/angiotensin II receptor blocker is being taken. Eye exam is current.  Hypertension This is a chronic problem. The current episode started more than 1 year ago. The problem is controlled. Pertinent negatives include no chest pain, headaches, palpitations or shortness of breath. Risk factors for coronary artery disease include diabetes mellitus, sedentary lifestyle and obesity. Past treatments include angiotensin blockers.     Review of Systems   Constitutional: Positive for fatigue. Negative for chills, fever and unexpected weight change.  HENT: Negative for trouble swallowing and voice change.   Eyes: Negative for visual disturbance.  Respiratory: Negative for cough, shortness of breath and wheezing.   Cardiovascular: Negative for chest pain, palpitations and leg swelling.  Gastrointestinal: Negative for diarrhea, nausea and vomiting.  Endocrine: Positive for polydipsia and polyuria. Negative for cold intolerance, heat intolerance and polyphagia.  Musculoskeletal: Negative for arthralgias and myalgias.  Skin: Negative for color change, pallor, rash and wound.  Neurological: Negative for seizures and headaches.  Psychiatric/Behavioral: Negative for confusion and suicidal ideas.    Objective:    Vitals with BMI 04/23/2020 01/19/2020 06/18/2018  Height   5' 4.5"  Weight 208 lbs 13 oz 205 lbs 10 oz 199 lbs  BMI 35.82 35.27 33.64  Systolic 140 130 161  Diastolic 104 80 70  Pulse 84 104 96    BP (!) 140/104   Pulse 84  Ht 5\' 4"  (1.626 m)   Wt 208 lb 12.8 oz (94.7 kg)   BMI 35.84 kg/m   Wt Readings from Last 3 Encounters:  04/23/20 208 lb 12.8 oz (94.7 kg)  01/19/20 205 lb 9.6 oz (93.3 kg)  06/18/18 199 lb (90.3 kg)     Physical Exam Constitutional:      Appearance: She is well-developed.  HENT:     Head: Normocephalic and atraumatic.  Neck:     Thyroid: No thyromegaly.     Trachea: No tracheal deviation.  Cardiovascular:     Rate and Rhythm: Normal rate and regular rhythm.  Pulmonary:     Effort: Pulmonary effort is normal.  Abdominal:     Tenderness: There is no abdominal tenderness. There is no guarding.  Musculoskeletal:        General: Normal range of motion.     Cervical back: Normal range of motion and neck supple.     Comments: Normal foot exam.  Skin:    General: Skin is warm and dry.     Coloration: Skin is not pale.     Findings: No erythema or rash.  Neurological:     Mental Status:  She is alert and oriented to person, place, and time.     Cranial Nerves: No cranial nerve deficit.     Coordination: Coordination normal.     Deep Tendon Reflexes: Reflexes are normal and symmetric.  Psychiatric:        Judgment: Judgment normal.       CMP ( most recent) CMP     Component Value Date/Time   NA 137 06/18/2018 1608   K 4.3 06/18/2018 1608   CL 108 06/18/2018 1608   CO2 23 06/18/2018 1608   GLUCOSE 158 (H) 06/18/2018 1608   BUN 13 08/15/2019 0000   CREATININE 0.8 08/15/2019 0000   CREATININE 0.76 06/18/2018 1608   CALCIUM 8.5 (L) 06/18/2018 1608   PROT 7.1 06/18/2018 1608   ALBUMIN 3.5 05/14/2016 1408   AST 13 06/18/2018 1608   ALT 10 06/18/2018 1608   ALKPHOS 71 05/14/2016 1408   BILITOT 0.4 06/18/2018 1608   GFRNONAA >60 08/15/2019 0000   GFRNONAA 92 06/18/2018 1608   GFRAA >60 08/15/2019 0000   GFRAA 107 06/18/2018 1608     Diabetic Labs (most recent): Lab Results  Component Value Date   HGBA1C 7.7 (A) 04/23/2020   HGBA1C 8.0 (A) 01/19/2020   HGBA1C 8.4 08/15/2019     Lipid Panel ( most recent) Lipid Panel     Component Value Date/Time   CHOL 188 08/15/2019 0000   TRIG 204 (A) 08/15/2019 0000   HDL 51 08/15/2019 0000   CHOLHDL 4 06/18/2018 1608   VLDL 25.4 06/18/2018 1608   LDLCALC 96 08/15/2019 0000      Lab Results  Component Value Date   TSH 0.93 08/15/2019      Assessment & Plan:   1. Type 2 diabetes mellitus without complication, without long-term current use of insulin (HCC)  - Rachel Stewart has currently uncontrolled symptomatic type 2 DM since  49 years of age.  She admits that she does not monitor blood glucose regularly.  Her point-of-care A1c today was 7.7%, slowly improving from 8.4% in March 2021.  She denies hypoglycemia.  Recent labs reviewed. - I had a long discussion with her about the progressive nature of diabetes and the pathology behind its complications. -her diabetes is complicated by obesity and she  remains at a  high risk for more acute and chronic complications which include CAD, CVA, CKD, retinopathy, and neuropathy. These are all discussed in detail with her.  - I have counseled her on diet  and weight management  by adopting a carbohydrate restricted/protein rich diet. Patient is encouraged to switch to  unprocessed or minimally processed     complex starch and increased protein intake (animal or plant source), fruits, and vegetables. -  she is advised to stick to a routine mealtimes to eat 3 meals  a day and avoid unnecessary snacks ( to snack only to correct hypoglycemia).  - she  admits there is a room for improvement in her diet and drink choices. -  Suggestion is made for her to avoid simple carbohydrates  from her diet including Cakes, Sweet Desserts / Pastries, Ice Cream, Soda (diet and regular), Sweet Tea, Candies, Chips, Cookies, Sweet Pastries,  Store Bought Juices, Alcohol in Excess of  1-2 drinks a day, Artificial Sweeteners, Coffee Creamer, and "Sugar-free" Products. This will help patient to have stable blood glucose profile and potentially avoid unintended weight gain.   - she will be scheduled with Norm SaltPenny Crumpton, RDN, CDE for diabetes education.  - I have approached her with the following individualized plan to manage  her diabetes and patient agrees:   -In light of her presentation with near target A1c of 7.7%, she will not need insulin treatment for now.  -During her last visit, she was found to have duplicate exposure to GLP-1 receptor agonists.  Her Ozempic was discontinued.  She is advised to continue Saxenda 3 mg subcutaneously daily.  -She does not tolerate Metformin.  She has benefited from low-dose glipizide.  She is advised to continue glipizide 5 mg XL p.o. daily at breakfast.    -She is willing to start monitoring blood glucose twice a day-daily before breakfast and at bedtime and return with her meter and logs during her next visit.  - she is encouraged to  call clinic for blood glucose levels less than 70 or above 200 mg /dl.  - Specific targets for  A1c;  LDL, HDL,  and Triglycerides were discussed with the patient.  2) Blood Pressure /Hypertension: Her blood pressure is controlled to target. she is advised to continue her current medications including losartan 50 mg p.o. daily with breakfast . 3) Lipids/Hyperlipidemia:   Review of her recent lipid panel showed un controlled  LDL at 96 .  she  is not on statins. She will like to wait until her next lipid panel before considering statin medications.    4)  Weight/Diet:  Body mass index is 35.84 kg/m.  -   clearly complicating her diabetes care.   she is  a candidate for weight loss. I discussed with her the fact that loss of 5 - 10% of her  current body weight will have the most impact on her diabetes management.  Exercise, and detailed carbohydrates information provided  -  detailed on discharge instructions.  Patient has lost more than 100 pounds with her to her last gastric bypass, currently retaining.  5) Chronic Care/Health Maintenance:  -she  is on ARB medications and  is encouraged to initiate and continue to follow up with Ophthalmology, Dentist,  Podiatrist at least yearly or according to recommendations, and advised to   stay away from smoking. I have recommended yearly flu vaccine and pneumonia vaccine at least every 5 years; moderate intensity exercise for up to 150 minutes weekly; and  sleep for  at least 7 hours a day.  - she is  advised to maintain close follow up with Floydene Flock, NP for primary care needs, as well as her other providers for optimal and coordinated care.   - Time spent on this patient care encounter:  35 min, of which > 50% was spent in  counseling and the rest reviewing her blood glucose logs , discussing her hypoglycemia and hyperglycemia episodes, reviewing her current and  previous labs / studies  ( including abstraction from other facilities) and medications   doses and developing a  long term treatment plan and documenting her care.   Please refer to Patient Instructions for Blood Glucose Monitoring and Insulin/Medications Dosing Guide"  in media tab for additional information. Please  also refer to " Patient Self Inventory" in the Media  tab for reviewed elements of pertinent patient history.  Rachel Stewart participated in the discussions, expressed understanding, and voiced agreement with the above plans.  All questions were answered to her satisfaction. she is encouraged to contact clinic should she have any questions or concerns prior to her return visit.    Follow up plan: - Return in about 4 months (around 08/21/2020) for F/U with Pre-visit Labs, Meter, Logs, A1c here.Marquis Lunch, MD [facility] [address] [place], [place] [address] Phone: [phone]  Fax: [phone]    04/23/2020, 12:58 PM  This note was partially dictated with voice recognition software. Similar sounding words can be transcribed inadequately or may not  be corrected upon review.

## 2020-04-23 NOTE — Patient Instructions (Signed)

## 2020-07-02 ENCOUNTER — Other Ambulatory Visit: Payer: Self-pay | Admitting: "Endocrinology

## 2020-08-17 LAB — LIPID PANEL
Chol/HDL Ratio: 3 ratio (ref 0.0–4.4)
Cholesterol, Total: 141 mg/dL (ref 100–199)
HDL: 47 mg/dL (ref >39)
LDL Chol Calc (NIH): 77 mg/dL (ref 0–99)
Triglycerides: 89 mg/dL (ref 0–149)
VLDL Cholesterol Cal: 17 mg/dL (ref 5–40)

## 2020-08-17 LAB — COMPREHENSIVE METABOLIC PANEL
ALT: 14 IU/L (ref 0–32)
AST: 16 IU/L (ref 0–40)
Albumin/Globulin Ratio: 1.1 — ABNORMAL LOW (ref 1.2–2.2)
Albumin: 4 g/dL (ref 3.8–4.9)
Alkaline Phosphatase: 77 IU/L (ref 44–121)
BUN/Creatinine Ratio: 13 (ref 9–23)
BUN: 10 mg/dL (ref 6–24)
Bilirubin Total: 0.3 mg/dL (ref 0.0–1.2)
CO2: 19 mmol/L — ABNORMAL LOW (ref 20–29)
Calcium: 9 mg/dL (ref 8.7–10.2)
Chloride: 104 mmol/L (ref 96–106)
Creatinine, Ser: 0.78 mg/dL (ref 0.57–1.00)
Globulin, Total: 3.7 g/dL (ref 1.5–4.5)
Glucose: 72 mg/dL (ref 65–99)
Potassium: 3.9 mmol/L (ref 3.5–5.2)
Sodium: 138 mmol/L (ref 134–144)
Total Protein: 7.7 g/dL (ref 6.0–8.5)
eGFR: 92 mL/min/{1.73_m2} (ref >59)

## 2020-08-17 LAB — T4, FREE: Free T4: 1.3 ng/dL (ref 0.82–1.77)

## 2020-08-17 LAB — VITAMIN D 25 HYDROXY (VIT D DEFICIENCY, FRACTURES): Vit D, 25-Hydroxy: 20.1 ng/mL — ABNORMAL LOW (ref 30.0–100.0)

## 2020-08-17 LAB — TSH: TSH: 0.836 u[IU]/mL (ref 0.450–4.500)

## 2020-08-22 ENCOUNTER — Ambulatory Visit: Payer: BC Managed Care – PPO | Admitting: "Endocrinology

## 2020-08-22 ENCOUNTER — Encounter: Payer: Self-pay | Admitting: "Endocrinology

## 2020-08-22 ENCOUNTER — Other Ambulatory Visit: Payer: Self-pay

## 2020-08-22 VITALS — BP 151/103 | HR 93 | Ht 64.0 in | Wt 206.2 lb

## 2020-08-22 DIAGNOSIS — E119 Type 2 diabetes mellitus without complications: Secondary | ICD-10-CM

## 2020-08-22 DIAGNOSIS — E782 Mixed hyperlipidemia: Secondary | ICD-10-CM

## 2020-08-22 DIAGNOSIS — I10 Essential (primary) hypertension: Secondary | ICD-10-CM

## 2020-08-22 DIAGNOSIS — E559 Vitamin D deficiency, unspecified: Secondary | ICD-10-CM

## 2020-08-22 LAB — POCT GLYCOSYLATED HEMOGLOBIN (HGB A1C): HbA1c, POC (controlled diabetic range): 7 % (ref 0.0–7.0)

## 2020-08-22 MED ORDER — VITAMIN D3 125 MCG (5000 UT) PO CAPS: 5000.0000 [IU] | ORAL_CAPSULE | Freq: Every day | ORAL | 0 refills | Status: DC

## 2020-08-22 NOTE — Progress Notes (Signed)
08/22/2020, 2:15 PM  Endocrinology follow-up note   Subjective:    Patient ID: Rachel Stewart, female    DOB: 01/12/1971.  [name] is being seen in follow-up after she was seen in consultation for management of currently uncontrolled symptomatic diabetes requested by  Floydene Flock, NP.   Past Medical History:  Diagnosis Date  . Bronchitis, chronic (HCC)    last tx. 8'17  . Diabetes mellitus without complication (HCC)   . GERD (gastroesophageal reflux disease)   . Hypertension   . Neuromuscular disorder (HCC)    "tic pain" left face near ear area. Carpal tunnel bilateral. Arthritis knee.  . Sleep apnea   . Ventral hernia    at present    Past Surgical History:  Procedure Laterality Date  . BREAST SURGERY     breast reduction  . 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 SPINE SURGERY     lumbar decompression    Social History   Socioeconomic History  . Marital status: Single    Spouse name: Not on file  . Number of children: Not on file  . Years of education: Not on file  . Highest education level: Not on file  Occupational History  . Not on file  Tobacco Use  . Smoking status: Never Smoker  . Smokeless tobacco: Never Used  Vaping Use  . Vaping Use: Never used  Substance and Sexual Activity  . Alcohol use: No  . Drug use: No  . Sexual activity: Yes  Other Topics Concern  . Not on file  Social History Narrative  . Not on file   Social Determinants of Health   Financial Resource Strain: Not on file  Food Insecurity: Not on file  Transportation Needs: Not on file  Physical Activity: Not on file  Stress: Not on file  Social Connections: Not on file    Family History  Problem Relation Age of Onset  .  Aneurysm Mother   . Diabetes Mother   . Hypertension Sister     Outpatient Encounter Medications as of 08/22/2020  Medication Sig  . Cholecalciferol (VITAMIN D3) 125 MCG (5000 UT) CAPS Take 1 capsule (5,000 Units total) by mouth daily.  . cyanocobalamin (,VITAMIN B-12,) 1000 MCG/ML injection Inject 1,000 mcg into the muscle every 30 (thirty) days.  Marland Kitchen FERRETTS 325 (106 Fe) MG TABS tablet Take 1 tablet by mouth daily.  Marland Kitchen glipiZIDE (GLUCOTROL XL) 5 MG 24 hr tablet TAKE 1 TABLET BY MOUTH EVERY DAY WITH BREAKFAST  . glucose blood test strip Use as instructed  -1x a day -One Touch Ultra  . hydrOXYzine (ATARAX/VISTARIL) 10 MG tablet Take 10 mg by mouth 3 (three) times daily as needed.  Marland Kitchen losartan (COZAAR) 50 MG tablet Take 50 mg by mouth daily.  . Multiple Vitamin (MULTIVITAMIN WITH MINERALS) TABS tablet Take 1 tablet by mouth daily.  Marland Kitchen SAXENDA 18 MG/3ML SOPN Inject 3 mg into the skin daily.   No facility-administered encounter medications on file as of 08/22/2020.    ALLERGIES: Allergies  Allergen Reactions  . Lisinopril Cough    VACCINATION STATUS:  There is no immunization history on file for this patient.  Diabetes She presents for her follow-up diabetic visit. She has type 2 diabetes mellitus. Onset  time: She was diagnosed at approximate age of 33 years. Her disease course has been improving. There are no hypoglycemic associated symptoms. Pertinent negatives for hypoglycemia include no confusion, headaches, pallor or seizures. Associated symptoms include fatigue. Pertinent negatives for diabetes include no chest pain, no polydipsia, no polyphagia and no polyuria. There are no hypoglycemic complications. Symptoms are improving. There are no diabetic complications. Risk factors for coronary artery disease include diabetes mellitus, hypertension, obesity and sedentary lifestyle. Current diabetic treatments: She is currently oSaxenda 3 mg daily, glipizide 5 mg p.o. daily. Her weight is  fluctuating minimally (Patient with history of moderate obesity, weight up to 275 pounds before she underwent Roux-en-Y gastric bypass in 2006 which allowed her to lose more than 100 pounds, however, more recently she is gaining.). She is following a generally unhealthy diet. When asked about meal planning, she reported none. She has not had a previous visit with a dietitian. She participates in exercise intermittently. Her home blood glucose trend is decreasing steadily. (She did not bring any logs nor meter to review.  She denies hypoglycemia.  Her point-of-care A1c 7%, progressively improving from 8.4% in March 2021.   ) An ACE inhibitor/angiotensin II receptor blocker is being taken. Eye exam is current.  Hypertension This is a chronic problem. The current episode started more than 1 year ago. The problem is controlled. Pertinent negatives include no chest pain, headaches, palpitations or shortness of breath. Risk factors for coronary artery disease include diabetes mellitus, sedentary lifestyle and obesity. Past treatments include angiotensin blockers.    Review of Systems  Constitutional: Positive for fatigue. Negative for chills, fever and unexpected weight change.  HENT: Negative for trouble swallowing and voice change.   Eyes: Negative for visual disturbance.  Respiratory: Negative for cough, shortness of breath and wheezing.   Cardiovascular: Negative for chest pain, palpitations and leg swelling.  Gastrointestinal: Negative for diarrhea, nausea and vomiting.  Endocrine: Negative for cold intolerance, heat intolerance, polydipsia, polyphagia and polyuria.  Musculoskeletal: Negative for arthralgias and myalgias.  Skin: Negative for color change, pallor, rash and wound.  Neurological: Negative for seizures and headaches.  Psychiatric/Behavioral: Negative for confusion and suicidal ideas.    Objective:    Vitals with BMI 08/22/2020 04/23/2020 01/19/2020  Height     Weight  206 lbs 3 oz 208 lbs 13 oz 205 lbs 10 oz  BMI 35.38 35.82 35.27  Systolic 151 140 161  Diastolic 103 104 80  Pulse 93 84 104    BP (!) 151/103 (BP Location: Left Arm, Patient Position: Supine) Comment: 134/92 in L arm after sitting for 5 minutes  Pulse 93   Ht  (1.626 m)   Wt 206 lb 3.2 oz (93.5 kg)   BMI 35.39 kg/m   Wt Readings from Last 3 Encounters:  08/22/20 206 lb 3.2 oz (93.5 kg)  04/23/20 208 lb 12.8 oz (94.7 kg)  01/19/20 205 lb 9.6 oz (93.3 kg)     Physical Exam Constitutional:      Appearance: She is well-developed.  HENT:     Head: Normocephalic and atraumatic.  Neck:     Thyroid: No thyromegaly.     Trachea: No tracheal deviation.  Cardiovascular:     Rate and Rhythm: Normal rate and regular rhythm.  Pulmonary:     Effort: Pulmonary effort is normal.  Abdominal:     Tenderness: There is no abdominal tenderness. There is no guarding.  Musculoskeletal:  General: Normal range of motion.     Cervical back: Normal range of motion and neck supple.     Comments: Normal foot exam.  Skin:    General: Skin is warm and dry.     Coloration: Skin is not pale.     Findings: No erythema or rash.  Neurological:     Mental Status: She is alert and oriented to person, place, and time.     Cranial Nerves: No cranial nerve deficit.     Coordination: Coordination normal.     Deep Tendon Reflexes: Reflexes are normal and symmetric.  Psychiatric:        Judgment: Judgment normal.       CMP ( most recent) CMP     Component Value Date/Time   NA 138 08/16/2020 0806   K 3.9 08/16/2020 0806   CL 104 08/16/2020 0806   CO2 19 (L) 08/16/2020 0806   GLUCOSE 72 08/16/2020 0806   GLUCOSE 158 (H) 06/18/2018 1608   BUN 10 08/16/2020 0806   CREATININE 0.78 08/16/2020 0806   CREATININE 0.76 06/18/2018 1608   CALCIUM 9.0 08/16/2020 0806   PROT 7.7 08/16/2020 0806   ALBUMIN 4.0 08/16/2020 0806   AST 16 08/16/2020 0806   ALT 14 08/16/2020 0806   ALKPHOS 77  08/16/2020 0806   BILITOT 0.3 08/16/2020 0806   GFRNONAA >60 08/15/2019 0000   GFRNONAA 92 06/18/2018 1608   GFRAA >60 08/15/2019 0000   GFRAA 107 06/18/2018 1608     Diabetic Labs (most recent): Lab Results  Component Value Date   HGBA1C 7.0 08/22/2020   HGBA1C 7.7 (A) 04/23/2020   HGBA1C 8.0 (A) 01/19/2020     Lipid Panel ( most recent) Lipid Panel     Component Value Date/Time   CHOL 141 08/16/2020 0806   TRIG 89 08/16/2020 0806   HDL 47 08/16/2020 0806   CHOLHDL 3.0 08/16/2020 0806   CHOLHDL 4 06/18/2018 1608   VLDL 25.4 06/18/2018 1608   LDLCALC 77 08/16/2020 0806   LABVLDL 17 08/16/2020 0806      Lab Results  Component Value Date   TSH 0.836 08/16/2020   TSH 0.93 08/15/2019   FREET4 1.30 08/16/2020      Assessment & Plan:   1. Type 2 diabetes mellitus without complication, without long-term current use of insulin (HCC)  - Faline N Zonia KiefStephens has currently uncontrolled symptomatic type 2 DM since  50 years of age. She did not bring any logs nor meter to review.  She denies hypoglycemia.  Her point-of-care A1c 7%, progressively improving from 8.4% in March 2021.    - I had a long discussion with her about the progressive nature of diabetes and the pathology behind its complications. -her diabetes is complicated by obesity and she remains at a high risk for more acute and chronic complications which include CAD, CVA, CKD, retinopathy, and neuropathy. These are all discussed in detail with her.  - I have counseled her on diet  and weight management  by adopting a carbohydrate restricted/protein rich diet. Patient is encouraged to switch to  unprocessed or minimally processed     complex starch and increased protein intake (animal or plant source), fruits, and vegetables. -  she is advised to stick to a routine mealtimes to eat 3 meals  a day and avoid unnecessary snacks ( to snack only to correct hypoglycemia).   - she acknowledges that there is a room for  improvement in her food and drink choices. -  Suggestion is made for her to avoid simple carbohydrates  from her diet including Cakes, Sweet Desserts, Ice Cream, Soda (diet and regular), Sweet Tea, Candies, Chips, Cookies, Store Bought Juices, Alcohol in Excess of  1-2 drinks a day, Artificial Sweeteners,  Coffee Creamer, and "Sugar-free" Products, Lemonade. This will help patient to have more stable blood glucose profile and potentially avoid unintended weight gain.   - she has been scheduled with Norm Salt, RDN, CDE for diabetes education.  - I have approached her with the following individualized plan to manage  her diabetes and patient agrees:   -In light of her presentation with near target A1c of 7%, she will not need insulin treatment for now.   -She still has access for and will continue to benefit from [place].  She is advised to continue Saxenda 3 mg subcutaneously daily.   -She does not tolerate Metformin.  She has benefited from low-dose glipizide.  She is advised to continue glipizide 5 mg XL p.o. daily at breakfast.    -She is willing to start monitoring blood glucose twice a day-daily before breakfast and at bedtime and return with her meter and logs during her next visit.  - she is encouraged to call clinic for blood glucose levels less than 70 or above 200 mg /dl.  - Specific targets for  A1c;  LDL, HDL,  and Triglycerides were discussed with the patient.  2) Blood Pressure /Hypertension: . Her blood pressure is not controlled to target.  Her repeat measurement is 134/92. she is advised to continue her current medications including losartan 50 mg p.o. daily with breakfast .  3) Lipids/Hyperlipidemia:   Review of her recent lipid panel showed improved LDL at 77.    she  is not on statins. She will like to wait until her next lipid panel before considering statin medications.    4)  Weight/Diet:  Body mass index is 35.39 kg/m.  -   clearly complicating her diabetes care.    she is  a candidate for weight loss. I discussed with her the fact that loss of 5 - 10% of her  current body weight will have the most impact on her diabetes management.  Exercise, and detailed carbohydrates information provided  -  detailed on discharge instructions.  Patient has lost more than 100 pounds with her to her last gastric bypass, currently retaining.  5) Chronic Care/Health Maintenance:  -she  is on ARB medications and  is encouraged to initiate and continue to follow up with Ophthalmology, Dentist,  Podiatrist at least yearly or according to recommendations, and advised to   stay away from smoking. I have recommended yearly flu vaccine and pneumonia vaccine at least every 5 years; moderate intensity exercise for up to 150 minutes weekly; and  sleep for at least 7 hours a day.  POCT ABI Results 08/22/20  Her screening ABI is normal today August 22, 2020.  Her neck study is in March 2027, or sooner if needed. Right ABI: 1.25      left ABI: 1.17  Right leg systolic / diastolic: 189/1 1 mmHg Left leg systolic / diastolic: 177/101 mmHg  Arm systolic / diastolic: 151/103 mmHG   - she is  advised to maintain close follow up with Floydene Flock, NP for primary care needs, as well as her other providers for optimal and coordinated care.   - Time spent on this patient care encounter:  40 min, of which > 50% was spent in  counseling  and the rest reviewing her blood glucose logs , discussing her hypoglycemia and hyperglycemia episodes, reviewing her current and  previous labs / studies  ( including abstraction from other facilities) and medications  doses and developing a  long term treatment plan and documenting her care.   Please refer to Patient Instructions for Blood Glucose Monitoring and Insulin/Medications Dosing Guide"  in media tab for additional information. Please  also refer to " Patient Self Inventory" in the Media  tab for reviewed elements of pertinent patient history.  Rachel Stewart participated in the discussions, expressed understanding, and voiced agreement with the above plans.  All questions were answered to her satisfaction. she is encouraged to contact clinic should she have any questions or concerns prior to her return visit.  Follow up plan: - Return in about 6 months (around 02/22/2021) for F/U with Pre-visit Labs, Meter, Logs, A1c here.Marquis Lunch, MD [facility] [address] [place], [place] [address] Phone: [phone]  Fax: [phone]    08/22/2020, 2:15 PM  This note was partially dictated with voice recognition software. Similar sounding words can be transcribed inadequately or may not  be corrected upon review.

## 2020-08-22 NOTE — Patient Instructions (Signed)
                                     Advice for Weight Management  -For most of us the best way to lose weight is by diet management. Generally speaking, diet management means consuming less calories intentionally which over time brings about progressive weight loss.  This can be achieved more effectively by restricting carbohydrate consumption to the minimum possible.  So, it is critically important to know your numbers: how much calorie you are consuming and how much calorie you need. More importantly, our carbohydrates sources should be unprocessed or minimally processed complex starch food items.   Sometimes, it is important to balance nutrition by increasing protein intake (animal or plant source), fruits, and vegetables.  -Sticking to a routine mealtime to eat 3 meals a day and avoiding unnecessary snacks is shown to have a big role in weight control. Under normal circumstances, the only time we lose real weight is when we are hungry, so allow hunger to take place- hunger means no food between meal times, only water.  It is not advisable to starve.   -It is better to avoid simple carbohydrates including: Cakes, Sweet Desserts, Ice Cream, Soda (diet and regular), Sweet Tea, Candies, Chips, Cookies, Store Bought Juices, Alcohol in Excess of  1-2 drinks a day, Lemonade,  Artificial Sweeteners, Doughnuts, Coffee Creamers, "Sugar-free" Products, etc, etc.  This is not a complete list.....    -Consulting with certified diabetes educators is proven to provide you with the most accurate and current information on diet.  Also, you may be  interested in discussing diet options/exchanges , we can schedule a visit with Rachel Stewart, RDN, CDE for individualized nutrition education.  -Exercise: If you are able: 30 -60 minutes a day ,4 days a week, or 150 minutes a week.  The longer the better.  Combine stretch, strength, and aerobic activities.  If you were told in the  past that you have high risk for cardiovascular diseases, you may seek evaluation by your heart doctor prior to initiating moderate to intense exercise programs.                                  Additional Care Considerations for Diabetes   -Diabetes  is a chronic disease.  The most important care consideration is regular follow-up with your diabetes care provider with the goal being avoiding or delaying its complications and to take advantage of advances in medications and technology.    -Type 2 diabetes is known to coexist with other important comorbidities such as high blood pressure and high cholesterol.  It is critical to control not only the diabetes but also the high blood pressure and high cholesterol to minimize and delay the risk of complications including coronary artery disease, stroke, amputations, blindness, etc.    - Studies showed that people with diabetes will benefit from a class of medications known as ACE inhibitors and statins.  Unless there are specific reasons not to be on these medications, the standard of care is to consider getting one from these groups of medications at an optimal doses.  These medications are generally considered safe and proven to help protect the heart and the kidneys.    - People with diabetes are encouraged to initiate and maintain regular follow-up with eye doctors, foot   doctors, dentists , and if necessary heart and kidney doctors.     - It is highly recommended that people with diabetes quit smoking or stay away from smoking, and get yearly  flu vaccine and pneumonia vaccine at least every 5 years.  One other important lifestyle recommendation is to ensure adequate sleep - at least 6-7 hours of uninterrupted sleep at night.  -Exercise: If you are able: 30 -60 minutes a day, 4 days a week, or 150 minutes a week.  The longer the better.  Combine stretch, strength, and aerobic activities.  If you were told in the past that you have high risk for  cardiovascular diseases, you may seek evaluation by your heart doctor prior to initiating moderate to intense exercise programs.          

## 2020-12-13 ENCOUNTER — Encounter (HOSPITAL_COMMUNITY): Payer: Self-pay | Admitting: *Deleted

## 2021-02-26 ENCOUNTER — Telehealth: Payer: Self-pay

## 2021-02-26 ENCOUNTER — Ambulatory Visit: Payer: BC Managed Care – PPO | Admitting: "Endocrinology

## 2021-02-26 ENCOUNTER — Other Ambulatory Visit: Payer: Self-pay

## 2021-02-26 ENCOUNTER — Other Ambulatory Visit: Payer: Self-pay | Admitting: "Endocrinology

## 2021-02-26 DIAGNOSIS — E559 Vitamin D deficiency, unspecified: Secondary | ICD-10-CM

## 2021-02-26 DIAGNOSIS — R5383 Other fatigue: Secondary | ICD-10-CM

## 2021-02-26 MED ORDER — ACCU-CHEK GUIDE VI STRP: ORAL_STRIP | 1 refills | Status: DC

## 2021-02-26 MED ORDER — ACCU-CHEK SOFTCLIX LANCETS MISC: 1 refills | Status: AC

## 2021-02-26 MED ORDER — ACCU-CHEK GUIDE W/DEVICE KIT: PACK | 1 refills | Status: DC

## 2021-02-26 NOTE — Telephone Encounter (Signed)
Supplies have been sent for patient.

## 2021-02-26 NOTE — Telephone Encounter (Signed)
Pt is requesting a new meter to be called in and the supplies to [facility] on [facility]. She does not know which will be covered by insurance.

## 2021-02-27 LAB — COMPREHENSIVE METABOLIC PANEL
ALT: 14 IU/L (ref 0–32)
AST: 13 IU/L (ref 0–40)
Albumin/Globulin Ratio: 1 — ABNORMAL LOW (ref 1.2–2.2)
Albumin: 3.7 g/dL — ABNORMAL LOW (ref 3.8–4.9)
Alkaline Phosphatase: 101 IU/L (ref 44–121)
BUN/Creatinine Ratio: 20 (ref 9–23)
BUN: 17 mg/dL (ref 6–24)
Bilirubin Total: 0.2 mg/dL (ref 0.0–1.2)
CO2: 21 mmol/L (ref 20–29)
Calcium: 8.7 mg/dL (ref 8.7–10.2)
Chloride: 100 mmol/L (ref 96–106)
Creatinine, Ser: 0.87 mg/dL (ref 0.57–1.00)
Globulin, Total: 3.6 g/dL (ref 1.5–4.5)
Glucose: 273 mg/dL — ABNORMAL HIGH (ref 65–99)
Potassium: 4.2 mmol/L (ref 3.5–5.2)
Sodium: 134 mmol/L (ref 134–144)
Total Protein: 7.3 g/dL (ref 6.0–8.5)
eGFR: 80 mL/min/{1.73_m2} (ref >59)

## 2021-02-27 LAB — VITAMIN D 25 HYDROXY (VIT D DEFICIENCY, FRACTURES): Vit D, 25-Hydroxy: 19 ng/mL — ABNORMAL LOW (ref 30.0–100.0)

## 2021-02-27 LAB — TSH: TSH: 0.957 u[IU]/mL (ref 0.450–4.500)

## 2021-02-27 LAB — T4, FREE: Free T4: 1.23 ng/dL (ref 0.82–1.77)

## 2021-03-06 ENCOUNTER — Ambulatory Visit: Payer: BC Managed Care – PPO | Admitting: "Endocrinology

## 2021-03-06 ENCOUNTER — Encounter: Payer: Self-pay | Admitting: "Endocrinology

## 2021-03-06 ENCOUNTER — Other Ambulatory Visit: Payer: Self-pay

## 2021-03-06 VITALS — BP 138/86 | HR 80 | Ht 64.0 in | Wt 205.4 lb

## 2021-03-06 DIAGNOSIS — E119 Type 2 diabetes mellitus without complications: Secondary | ICD-10-CM | POA: Diagnosis not present

## 2021-03-06 DIAGNOSIS — E559 Vitamin D deficiency, unspecified: Secondary | ICD-10-CM

## 2021-03-06 LAB — POCT GLYCOSYLATED HEMOGLOBIN (HGB A1C): HbA1c, POC (controlled diabetic range): 9.9 % — AB (ref 0.0–7.0)

## 2021-03-06 MED ORDER — VITAMIN D3 125 MCG (5000 UT) PO CAPS: 5000.0000 [IU] | ORAL_CAPSULE | Freq: Every day | ORAL | 1 refills | Status: AC

## 2021-03-06 MED ORDER — TRULICITY 3 MG/0.5ML ~~LOC~~ SOAJ: 3.0000 mg | SUBCUTANEOUS | 2 refills | Status: DC

## 2021-03-06 NOTE — Progress Notes (Signed)
08/22/2020, 2:15 PM  Endocrinology follow-up note   Subjective:    Patient ID: Rachel Stewart, female    DOB: 1971-01-20.  Rachel Stewart is being seen in follow-up after she was seen in consultation for management of currently uncontrolled symptomatic diabetes requested by  Floydene Flock, NP.   Past Medical History:  Diagnosis Date   Bronchitis, chronic (HCC)    last tx. 8'17   Diabetes mellitus without complication (HCC)    GERD (gastroesophageal reflux disease)    Hypertension    Neuromuscular disorder (HCC)    "tic pain" left face near ear area. Carpal tunnel bilateral. Arthritis knee.   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/2016   Procedure: LAPAROSCOPIC ROUX-EN-Y GASTRIC BYPASS WITH HIATAL HERNIA REPAIR, UPPER ENDO;  Surgeon: Luretha Murphy, MD;  Location: [facility];  Service: General;  Laterality: N/A;   SPINE SURGERY     lumbar decompression    Social History   Socioeconomic History   Marital status: Single    Spouse name: Not on file   Number of children: Not on file   Years of education: Not on file   Highest education level: Not on file  Occupational History   Not on file  Tobacco Use   Smoking status: Never Smoker   Smokeless tobacco: Never Used  Vaping Use   Vaping Use: Never used  Substance and Sexual Activity   Alcohol use: No   Drug use: No   Sexual activity: Yes  Other Topics Concern   Not on file  Social History Narrative   Not on file   Social Determinants of Health   Financial Resource Strain: Not on file  Food Insecurity: Not on file  Transportation Needs: Not on file  Physical Activity: Not on file  Stress: Not on file  Social Connections: Not on file    Family History  Problem Relation Age of Onset   Aneurysm Mother     Diabetes Mother    Hypertension Sister     Outpatient Encounter Medications as of 08/22/2020  Medication Sig   Cholecalciferol (VITAMIN D3) 125 MCG (5000 UT) CAPS Take 1 capsule (5,000 Units total) by mouth daily.   cyanocobalamin (,VITAMIN B-12,) 1000 MCG/ML injection Inject 1,000 mcg into the muscle every 30 (thirty) days.   FERRETTS 325 (106 Fe) MG TABS tablet Take 1 tablet by mouth daily.   glipiZIDE (GLUCOTROL XL) 5 MG 24 hr tablet TAKE 1 TABLET BY MOUTH EVERY DAY WITH BREAKFAST   glucose blood test strip Use as instructed  -1x a day -One Touch Ultra   hydrOXYzine (ATARAX/VISTARIL) 10 MG tablet Take 10 mg by mouth 3 (three) times daily as needed.   losartan (COZAAR) 50 MG tablet Take 50 mg by mouth daily.   Multiple Vitamin (MULTIVITAMIN WITH MINERALS) TABS tablet Take 1 tablet by mouth daily.   SAXENDA 18 MG/3ML SOPN Inject 3 mg into the skin daily.   No facility-administered encounter medications on file as of 08/22/2020.    ALLERGIES: Allergies  Allergen Reactions   Lisinopril Cough    VACCINATION STATUS:  There is no immunization history on file for this patient.  Diabetes She presents for her follow-up diabetic visit. She has type 2 diabetes mellitus. Onset  time: She was diagnosed at approximate age of 33 years. Her disease course has been worsening. There are no hypoglycemic associated symptoms. Pertinent negatives for hypoglycemia include no confusion, headaches, pallor or seizures. Associated symptoms include fatigue, polydipsia and polyuria. Pertinent negatives for diabetes include no chest pain and no polyphagia. There are no hypoglycemic complications. Symptoms are worsening. There are no diabetic complications. Risk factors for coronary artery disease include diabetes mellitus, hypertension, obesity and sedentary lifestyle. Current diabetic treatments: She is currently oSaxenda 3 mg daily, glipizide 5 mg p.o. daily. Her weight is fluctuating minimally (Patient with  history of moderate obesity, weight up to 275 pounds before she underwent Roux-en-Y gastric bypass in 2006 which allowed her to lose more than 100 pounds, however, more recently she is gaining.). She is following a generally unhealthy diet. When asked about meal planning, she reported none. She has not had a previous visit with a dietitian. She participates in exercise intermittently. (She did not bring any logs nor meter.  However her point-of-care A1c is 9.9%, increasing from 7%.  She has lost coverage for Saxenda, currently remains only on glipizide 5 mg p.o. daily.   ) An ACE inhibitor/angiotensin II receptor blocker is being taken. Eye exam is current.  Hypertension This is a chronic problem. The current episode started more than 1 year ago. The problem is controlled. Pertinent negatives include no chest pain, headaches, palpitations or shortness of breath. Risk factors for coronary artery disease include diabetes mellitus, sedentary lifestyle and obesity. Past treatments include angiotensin blockers.   Review of Systems  Constitutional:  Positive for fatigue. Negative for chills, fever and unexpected weight change.  HENT:  Negative for trouble swallowing and voice change.   Eyes:  Negative for visual disturbance.  Respiratory:  Negative for cough, shortness of breath and wheezing.   Cardiovascular:  Negative for chest pain, palpitations and leg swelling.  Gastrointestinal:  Negative for diarrhea, nausea and vomiting.  Endocrine: Positive for polydipsia and polyuria. Negative for cold intolerance, heat intolerance and polyphagia.  Musculoskeletal:  Negative for arthralgias and myalgias.  Skin:  Negative for color change, pallor, rash and wound.  Neurological:  Negative for seizures and headaches.  Psychiatric/Behavioral:  Negative for confusion and suicidal ideas.    Objective:    Vitals with BMI 08/22/2020 04/23/2020 01/19/2020  Height 5\' 4"  5\' 4"  5\' 4"   Weight 206 lbs 3 oz 208 lbs 13 oz  205 lbs 10 oz  BMI 35.38 35.82 35.27  Systolic 151 140  Diastolic 103 104 80  Pulse 93 84 104    BP (!) 151/103 (BP Location: Left Arm, Patient Position: Supine) Comment: 134/92 in L arm after sitting for 5 minutes  Pulse 93   Ht 5\' 4"  (1.626 m)   Wt 206 lb 3.2 oz (93.5 kg)   BMI 35.39 kg/m   Wt Readings from Last 3 Encounters:  08/22/20 206 lb 3.2 oz (93.5 kg)  04/23/20 208 lb 12.8 oz (94.7 kg)  01/19/20 205 lb 9.6 oz (93.3 kg)     Physical Exam Constitutional:      Appearance: She is well-developed.  HENT:     Head: Normocephalic and atraumatic.  Neck:     Thyroid: No thyromegaly.     Trachea: No tracheal deviation.  Cardiovascular:     Rate and Rhythm: Normal rate and regular rhythm.  Pulmonary:     Effort: Pulmonary effort is normal.  Abdominal:     Tenderness: There is no abdominal tenderness. There is  no guarding.  Musculoskeletal:        General: Normal range of motion.     Cervical back: Normal range of motion and neck supple.     Comments: Normal foot exam.  Skin:    General: Skin is warm and dry.     Coloration: Skin is not pale.     Findings: No erythema or rash.  Neurological:     Mental Status: She is alert and oriented to person, place, and time.     Cranial Nerves: No cranial nerve deficit.     Coordination: Coordination normal.     Deep Tendon Reflexes: Reflexes are normal and symmetric.  Psychiatric:        Judgment: Judgment normal.      CMP ( most recent) CMP     Component Value Date/Time   NA 138 08/16/2020 0806   K 3.9 08/16/2020 0806   CL 104 08/16/2020 0806   CO2 19 (L) 08/16/2020 0806   GLUCOSE 72 08/16/2020 0806   GLUCOSE 158 (H) 06/18/2018 1608   BUN 10 08/16/2020 0806   CREATININE 0.78 08/16/2020 0806   CREATININE 0.76 06/18/2018 1608   CALCIUM 9.0 08/16/2020 0806   PROT 7.7 08/16/2020 0806   ALBUMIN 4.0 08/16/2020 0806   AST 16 08/16/2020 0806   ALT 14 08/16/2020 0806   ALKPHOS 77 08/16/2020 0806   BILITOT 0.3  08/16/2020 0806   GFRNONAA >60 08/15/2019 0000   GFRNONAA 92 06/18/2018 1608   GFRAA >60 08/15/2019 0000   GFRAA 107 06/18/2018 1608     Diabetic Labs (most recent): Lab Results  Component Value Date   HGBA1C 7.0 08/22/2020   HGBA1C 7.7 (A) 04/23/2020   HGBA1C 8.0 (A) 01/19/2020     Lipid Panel ( most recent) Lipid Panel     Component Value Date/Time   CHOL 141 08/16/2020 0806   TRIG 89 08/16/2020 0806   HDL 47 08/16/2020 0806   CHOLHDL 3.0 08/16/2020 0806   CHOLHDL 4 06/18/2018 1608   VLDL 25.4 06/18/2018 1608   LDLCALC 77 08/16/2020 0806   LABVLDL 17 08/16/2020 0806      Lab Results  Component Value Date   TSH 0.836 08/16/2020   TSH 0.93 08/15/2019   FREET4 1.30 08/16/2020      Assessment & Plan:   1. Type 2 diabetes mellitus without complication, without long-term current use of insulin (HCC)  - Khyra N Kingbird has currently uncontrolled symptomatic type 2 DM since  50 years of age.  She did not bring any logs nor meter.  However her point-of-care A1c is 9.9%, increasing from 7%.  She has lost coverage for Saxenda, currently remains only on glipizide 5 mg p.o. daily.    - I had a long discussion with her about the progressive nature of diabetes and the pathology behind its complications. -her diabetes is complicated by obesity and she remains at a high risk for more acute and chronic complications which include CAD, CVA, CKD, retinopathy, and neuropathy. These are all discussed in detail with her.  - I have counseled her on diet  and weight management  by adopting a carbohydrate restricted/protein rich diet. Patient is encouraged to switch to  unprocessed or minimally processed     complex starch and increased protein intake (animal or plant source), fruits, and vegetables. -  she is advised to stick to a routine mealtimes to eat 3 meals  a day and avoid unnecessary snacks ( to snack only to correct hypoglycemia).   -  she acknowledges that there is a room  for improvement in her food and drink choices. - Suggestion is made for her to avoid simple carbohydrates  from her diet including Cakes, Sweet Desserts, Ice Cream, Soda (diet and regular), Sweet Tea, Candies, Chips, Cookies, Store Bought Juices, Alcohol in Excess of  1-2 drinks a day, Artificial Sweeteners,  Coffee Creamer, and "Sugar-free" Products, Lemonade. This will help patient to have more stable blood glucose profile and potentially avoid unintended weight gain.   - she has been scheduled with Norm Salt, RDN, CDE for diabetes education.  - I have approached her with the following individualized plan to manage  her diabetes and patient agrees:   -In light of her presentation with significant loss of control of diabetes with A1c of 9.9% she will need additional treatment.  It appears that her insurance will provide coverage for Trulicity.  I discussed and switched her Saxenda prescription to Trulicity 3 mg subcutaneously weekly.  Injection technique was demonstrated and explained to her in the exam room. -She is approached to monitor blood glucose twice a day-daily before breakfast and at bedtime.  - she is encouraged to call clinic for blood glucose levels less than 70 or above 200 mg /dl. -  She has benefited from low-dose glipizide.  She is advised to continue glipizide 5 mg XL p.o. daily at breakfast.    - Specific targets for  A1c;  LDL, HDL,  and Triglycerides were discussed with the patient.  2) Blood Pressure /Hypertension: . Her blood pressure is controlled to target at 138/86 mmHg today.  she is advised to continue her current medications including losartan 50 mg p.o. daily with breakfast .  3) Lipids/Hyperlipidemia:   Review of her recent lipid panel showed improved LDL at 77.    she  is not on statins. She will like to wait until her next lipid panel before considering statin medications.    4)  Weight/Diet:  Body mass index is 35.39 kg/m.  -   clearly complicating her  diabetes care.   she is  a candidate for weight loss. I discussed with her the fact that loss of 5 - 10% of her  current body weight will have the most impact on her diabetes management.  Exercise, and detailed carbohydrates information provided  -  detailed on discharge instructions.  Patient has lost more than 100 pounds with her to her last gastric bypass, currently retaining.  5) Chronic Care/Health Maintenance:  -she  is on ARB medications and  is encouraged to initiate and continue to follow up with Ophthalmology, Dentist,  Podiatrist at least yearly or according to recommendations, and advised to   stay away from smoking. I have recommended yearly flu vaccine and pneumonia vaccine at least every 5 years; moderate intensity exercise for up to 150 minutes weekly; and  sleep for at least 7 hours a day. Her screening ABI was normal in March 2022.  Her study will be repeated in March 2027 or sooner if needed.   - she is  advised to maintain close follow up with Floydene Flock, NP for primary care needs, as well as her other providers for optimal and coordinated care.   I spent 32 minutes in the care of the patient today including review of labs from CMP, Lipids, Thyroid Function, Hematology (current and previous including abstractions from other facilities); face-to-face time discussing  her blood glucose readings/logs, discussing hypoglycemia and hyperglycemia episodes and symptoms, medications doses, her options of  short and long term treatment based on the latest standards of care / guidelines;  discussion about incorporating lifestyle medicine;  and documenting the encounter.    Please refer to Patient Instructions for Blood Glucose Monitoring and Insulin/Medications Dosing Guide"  in media tab for additional information. Please  also refer to " Patient Self Inventory" in the Media  tab for reviewed elements of pertinent patient history.  Rachel Stewart participated in the discussions,  expressed understanding, and voiced agreement with the above plans.  All questions were answered to her satisfaction. she is encouraged to contact clinic should she have any questions or concerns prior to her return visit.  Follow up plan: - Return in about 6 months (around 02/22/2021) for F/U with Pre-visit Labs, Meter, Logs, A1c here.Marquis Lunch, MD [facility] [address] [place], [place] [address] Phone: [phone]  Fax: [phone]    08/22/2020, 2:15 PM  This note was partially dictated with voice recognition software. Similar sounding words can be transcribed inadequately or may not  be corrected upon review.

## 2021-03-06 NOTE — Patient Instructions (Signed)

## 2021-06-05 ENCOUNTER — Telehealth: Payer: Self-pay | Admitting: Nurse Practitioner

## 2021-06-05 NOTE — Telephone Encounter (Signed)
Pt called and states she has not had her trulicity in 2 weeks because no pharmacies around her has this medication. She is not sure what she needs to do.

## 2021-06-06 NOTE — Telephone Encounter (Signed)
She can try calling mail order pharmacies and see, they usually have a larger stock.

## 2021-06-06 NOTE — Telephone Encounter (Signed)
Informed patient, she will cb if she finds a pharmacy that has it in stock

## 2021-06-17 ENCOUNTER — Encounter: Payer: Self-pay | Admitting: "Endocrinology

## 2021-06-17 ENCOUNTER — Other Ambulatory Visit: Payer: Self-pay

## 2021-06-17 ENCOUNTER — Ambulatory Visit (INDEPENDENT_AMBULATORY_CARE_PROVIDER_SITE_OTHER): Payer: BC Managed Care – PPO | Admitting: "Endocrinology

## 2021-06-17 VITALS — BP 130/84 | HR 88 | Ht 64.0 in | Wt 208.8 lb

## 2021-06-17 DIAGNOSIS — E559 Vitamin D deficiency, unspecified: Secondary | ICD-10-CM

## 2021-06-17 DIAGNOSIS — I10 Essential (primary) hypertension: Secondary | ICD-10-CM | POA: Diagnosis not present

## 2021-06-17 DIAGNOSIS — E782 Mixed hyperlipidemia: Secondary | ICD-10-CM | POA: Diagnosis not present

## 2021-06-17 DIAGNOSIS — E119 Type 2 diabetes mellitus without complications: Secondary | ICD-10-CM

## 2021-06-17 LAB — POCT GLYCOSYLATED HEMOGLOBIN (HGB A1C): HbA1c, POC (controlled diabetic range): 8.9 % — AB (ref 0.0–7.0)

## 2021-06-17 NOTE — Progress Notes (Signed)
06/17/2021, 3:21 PM  Endocrinology follow-up note   Subjective:    Patient ID: Rachel Stewart, female    DOB: June 25, 1970.  Rachel Stewart is being seen in follow-up after she was seen in consultation for management of currently uncontrolled symptomatic diabetes requested by  Eulogio Ditch, NP.   Past Medical History:  Diagnosis Date   Bronchitis, chronic ([place])    last tx. 8'17   Diabetes mellitus without complication ([place])    GERD (gastroesophageal reflux disease)    Hypertension    Neuromuscular disorder ([place])    "tic pain" left face near ear area. Carpal tunnel bilateral. Arthritis knee.   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/2016   Procedure: LAPAROSCOPIC ROUX-EN-Y GASTRIC BYPASS WITH HIATAL HERNIA REPAIR, UPPER ENDO;  Surgeon: Johnathan Hausen, MD;  Location: [facility];  Service: General;  Laterality: N/A;   SPINE SURGERY     lumbar decompression    Social History   Socioeconomic History   Marital status: Single    Spouse name: Not on file   Number of children: Not on file   Years of education: Not on file   Highest education level: Not on file  Occupational History   Not on file  Tobacco Use   Smoking status: Never   Smokeless tobacco: Never  Vaping Use   Vaping Use: Never used  Substance and Sexual Activity   Alcohol use: No   Drug use: No   Sexual activity: Yes  Other Topics Concern   Not on file  Social History Narrative   Not on file   Social Determinants of Health   Financial Resource Strain: Not on file  Food Insecurity: Not on file  Transportation Needs: Not on file  Physical Activity: Not on file  Stress: Not on file  Social Connections: Not on file    Family History  Problem Relation Age of Onset   Aneurysm Mother    Diabetes Mother     Hypertension Sister     Outpatient Encounter Medications as of 06/17/2021  Medication Sig   Accu-Chek Softclix Lancets lancets Use as instructed to test blood glucose two times daily. Once in the morning and once at bedtime.   Blood Glucose Monitoring Suppl (ACCU-CHEK GUIDE) w/Device KIT Use to test blood glucose two times daily. Once in the morning and once at bedtime.   Cholecalciferol (VITAMIN D3) 125 MCG (5000 UT) CAPS Take 1 capsule (5,000 Units total) by mouth daily.   cyanocobalamin (,VITAMIN B-12,) 1000 MCG/ML injection Inject 1,000 mcg into the muscle every 30 (thirty) days.   Dulaglutide (TRULICITY) 3 XI/3.3AS SOPN Inject 3 mg as directed once a week.   FERRETTS 325 (106 Fe) MG TABS tablet Take 1 tablet by mouth daily.   glipiZIDE (GLUCOTROL XL) 5 MG 24 hr tablet TAKE 1 TABLET BY MOUTH EVERY DAY WITH BREAKFAST   glucose blood (ACCU-CHEK GUIDE) test strip Use as instructed to check blood glucose two times daily. Once in the morning and once at bedtime.   glucose blood test strip Use as instructed  -1x a day -One Touch Ultra   hydrOXYzine (ATARAX/VISTARIL) 10 MG tablet Take 10 mg by mouth 3 (three) times daily as needed.   losartan (COZAAR) 50 MG tablet  Take 50 mg by mouth daily.   Multiple Vitamin (MULTIVITAMIN WITH MINERALS) TABS tablet Take 1 tablet by mouth daily.   phentermine (ADIPEX-P) 37.5 MG tablet Take 37.5 mg by mouth every morning.   No facility-administered encounter medications on file as of 06/17/2021.    ALLERGIES: Allergies  Allergen Reactions   Lisinopril Cough    VACCINATION STATUS:  There is no immunization history on file for this patient.  Diabetes She presents for her follow-up diabetic visit. She has type 2 diabetes mellitus. Onset time: She was diagnosed at approximate age of 54 years. Her disease course has been improving. There are no hypoglycemic associated symptoms. Pertinent negatives for hypoglycemia include no confusion, headaches, pallor or  seizures. Associated symptoms include fatigue. Pertinent negatives for diabetes include no chest pain, no polydipsia, no polyphagia and no polyuria. There are no hypoglycemic complications. Symptoms are improving. There are no diabetic complications. Risk factors for coronary artery disease include diabetes mellitus, hypertension, obesity and sedentary lifestyle. Current diabetic treatments: She is currently oSaxenda 3 mg daily, glipizide 5 mg p.o. daily. Her weight is fluctuating minimally (Patient with history of moderate obesity, weight up to 275 pounds before she underwent Roux-en-Y gastric bypass in 2006 which allowed her to lose more than 100 pounds, however, more recently she is gaining.). She is following a generally unhealthy diet. When asked about meal planning, she reported none. She has not had a previous visit with a dietitian. She participates in exercise intermittently. (She did not bring any logs nor meter.  Her point-of-care A1c is 8.9% improving from 9.9% during her last visit.  In fact, she did not take her Trulicity properly for the past majority of the interval time.  She did not have hypoglycemia. ) An ACE inhibitor/angiotensin II receptor blocker is being taken. Eye exam is current.  Hypertension This is a chronic problem. The current episode started more than 1 year ago. The problem is controlled. Pertinent negatives include no chest pain, headaches, palpitations or shortness of breath. Risk factors for coronary artery disease include diabetes mellitus, sedentary lifestyle and obesity. Past treatments include angiotensin blockers.   Review of Systems  Constitutional:  Positive for fatigue. Negative for chills, fever and unexpected weight change.  HENT:  Negative for trouble swallowing and voice change.   Eyes:  Negative for visual disturbance.  Respiratory:  Negative for cough, shortness of breath and wheezing.   Cardiovascular:  Negative for chest pain, palpitations and leg  swelling.  Gastrointestinal:  Negative for diarrhea, nausea and vomiting.  Endocrine: Negative for cold intolerance, heat intolerance, polydipsia, polyphagia and polyuria.  Musculoskeletal:  Negative for arthralgias and myalgias.  Skin:  Negative for color change, pallor, rash and wound.  Neurological:  Negative for seizures and headaches.  Psychiatric/Behavioral:  Negative for confusion and suicidal ideas.    Objective:    Vitals with BMI 06/17/2021 03/06/2021 08/22/2020  Height 5' 4"  5' 4"  5' 4"   Weight 208 lbs 13 oz 205 lbs 6 oz 206 lbs 3 oz  BMI 35.82 50.38 88.28  Systolic 003 491 791  Diastolic 84 86 505  Pulse 88 80 93    BP 130/84    Pulse 88    Ht 5' 4"  (1.626 m)    Wt 208 lb 12.8 oz (94.7 kg)    BMI 35.84 kg/m   Wt Readings from Last 3 Encounters:  06/17/21 208 lb 12.8 oz (94.7 kg)  03/06/21 205 lb 6.4 oz (93.2 kg)  08/22/20 206 lb 3.2 oz (  93.5 kg)     Physical Exam Constitutional:      Appearance: She is well-developed.  HENT:     Head: Normocephalic and atraumatic.  Neck:     Thyroid: No thyromegaly.     Trachea: No tracheal deviation.  Cardiovascular:     Rate and Rhythm: Normal rate and regular rhythm.  Pulmonary:     Effort: Pulmonary effort is normal.  Abdominal:     Tenderness: There is no abdominal tenderness. There is no guarding.  Musculoskeletal:        General: Normal range of motion.     Cervical back: Normal range of motion and neck supple.     Comments: Normal foot exam.  Skin:    General: Skin is warm and dry.     Coloration: Skin is not pale.     Findings: No erythema or rash.  Neurological:     Mental Status: She is alert and oriented to person, place, and time.     Cranial Nerves: No cranial nerve deficit.     Coordination: Coordination normal.     Deep Tendon Reflexes: Reflexes are normal and symmetric.  Psychiatric:        Judgment: Judgment normal.      CMP ( most recent) CMP     Component Value Date/Time   NA 134 02/26/2021  1210   K 4.2 02/26/2021 1210   CL 100 02/26/2021 1210   CO2 21 02/26/2021 1210   GLUCOSE 273 (H) 02/26/2021 1210   GLUCOSE 158 (H) 06/18/2018 1608   BUN 17 02/26/2021 1210   CREATININE 0.87 02/26/2021 1210   CREATININE 0.76 06/18/2018 1608   CALCIUM 8.7 02/26/2021 1210   PROT 7.3 02/26/2021 1210   ALBUMIN 3.7 (L) 02/26/2021 1210   AST 13 02/26/2021 1210   ALT 14 02/26/2021 1210   ALKPHOS 101 02/26/2021 1210   BILITOT 0.2 02/26/2021 1210   GFRNONAA >60 08/15/2019 0000   GFRNONAA 92 06/18/2018 1608   GFRAA >60 08/15/2019 0000   GFRAA 107 06/18/2018 1608     Diabetic Labs (most recent): Lab Results  Component Value Date   HGBA1C 8.9 (A) 06/17/2021   HGBA1C 9.9 (A) 03/06/2021   HGBA1C 7.0 08/22/2020     Lipid Panel ( most recent) Lipid Panel     Component Value Date/Time   CHOL 141 08/16/2020 0806   TRIG 89 08/16/2020 0806   HDL 47 08/16/2020 0806   CHOLHDL 3.0 08/16/2020 0806   CHOLHDL 4 06/18/2018 1608   VLDL 25.4 06/18/2018 1608   LDLCALC 77 08/16/2020 0806   LABVLDL 17 08/16/2020 0806      Lab Results  Component Value Date   TSH 0.957 02/26/2021   TSH 0.836 08/16/2020   TSH 0.93 08/15/2019   FREET4 1.23 02/26/2021   FREET4 1.30 08/16/2020      Assessment & Plan:   1. Type 2 diabetes mellitus without complication, without long-term current use of insulin (H[facility])  - Aisa N Lueck has currently uncontrolled symptomatic type 2 DM since  51 years of age.  She did not bring any logs nor meter.  Her point-of-care A1c is 8.9% improving from 9.9% during her last visit.  In fact, she did not take her Trulicity properly for the past majority of the interval time.  She did not have hypoglycemia.   - I had a long discussion with her about the progressive nature of diabetes and the pathology behind its complications. -her diabetes is complicated by obesity and  she remains at a high risk for more acute and chronic complications which include CAD, CVA, CKD,  retinopathy, and neuropathy. These are all discussed in detail with her.  - I have counseled her on diet  and weight management  by adopting a carbohydrate restricted/protein rich diet. Patient is encouraged to switch to  unprocessed or minimally processed     complex starch and increased protein intake (animal or plant source), fruits, and vegetables. -  she is advised to stick to a routine mealtimes to eat 3 meals  a day and avoid unnecessary snacks ( to snack only to correct hypoglycemia).   - she acknowledges that there is a room for improvement in her food and drink choices. - Suggestion is made for her to avoid simple carbohydrates  from her diet including Cakes, Sweet Desserts, Ice Cream, Soda (diet and regular), Sweet Tea, Candies, Chips, Cookies, Store Bought Juices, Alcohol , Artificial Sweeteners,  Coffee Creamer, and "Sugar-free" Products, Lemonade. This will help patient to have more stable blood glucose profile and potentially avoid unintended weight gain.  The following Lifestyle Medicine recommendations according to [facility]) were discussed and and offered to patient and she  agrees to start the journey:  A. Whole Foods, Plant-Based Nutrition comprising of fruits and vegetables, plant-based proteins, whole-grain carbohydrates was discussed in detail with the patient.   A list for source of those nutrients were also provided to the patient.  Patient will use only water or unsweetened tea for hydration. B.  The need to stay away from risky substances including alcohol, smoking; obtaining 7 to 9 hours of restorative sleep, at least 150 minutes of moderate intensity exercise weekly, the importance of healthy social connections,  and stress management techniques were discussed.   - she has been scheduled with Jearld Fenton, RDN, CDE for diabetes education.  - I have approached her with the following individualized plan to manage  her diabetes and  patient agrees:   -In light of her presentation with improved glycemic profile, she will not need insulin treatment for now.  She is advised to continue Trulicity 3 mg subcutaneously weekly, continue glipizide 5 mg XL p.o. daily at breakfast.  She is urged and advised to monitor blood glucose at least twice a day-daily before breakfast and at bedtime.    - she is encouraged to call clinic for blood glucose levels less than 70 or above 200 mg /dl.   - Specific targets for  A1c;  LDL, HDL,  and Triglycerides were discussed with the patient.  2) Blood Pressure /Hypertension: .  Her blood pressure is controlled to target. she is advised to continue her current medications including losartan 50 mg p.o. daily with breakfast .  3) Lipids/Hyperlipidemia:   Review of her recent lipid panel showed improved LDL at 77.    she  is not on statins. She will like to wait until her next lipid panel before considering statin medications.    4)  Weight/Diet:  Body mass index is 35.84 kg/m.  -   clearly complicating her diabetes care.   she is  a candidate for weight loss. I discussed with her the fact that loss of 5 - 10% of her  current body weight will have the most impact on her diabetes management.  Exercise, and detailed carbohydrates information provided  -  detailed on discharge instructions.  Patient has lost more than 100 pounds with her to her last gastric bypass, currently  retaining.  5) Chronic Care/Health Maintenance:  -she  is on ARB medications and  is encouraged to initiate and continue to follow up with Ophthalmology, Dentist,  Podiatrist at least yearly or according to recommendations, and advised to   stay away from smoking. I have recommended yearly flu vaccine and pneumonia vaccine at least every 5 years; moderate intensity exercise for up to 150 minutes weekly; and  sleep for at least 7 hours a day. Her screening ABI was normal in March 2022.  Her study will be repeated in March 2027 or  sooner if needed.   - she is  advised to maintain close follow up with Eulogio Ditch, NP for primary care needs, as well as her other providers for optimal and coordinated care.    I spent 30 minutes in the care of the patient today including review of labs from [place], Lipids, Thyroid Function, Hematology (current and previous including abstractions from other facilities); face-to-face time discussing  her blood glucose readings/logs, discussing hypoglycemia and hyperglycemia episodes and symptoms, medications doses, her options of short and long term treatment based on the latest standards of care / guidelines;  discussion about incorporating lifestyle medicine;  and documenting the encounter.    Please refer to Patient Instructions for Blood Glucose Monitoring and Insulin/Medications Dosing Guide"  in media tab for additional information. Please  also refer to " Patient Self Inventory" in the Media  tab for reviewed elements of pertinent patient history.  Rachel Stewart participated in the discussions, expressed understanding, and voiced agreement with the above plans.  All questions were answered to her satisfaction. she is encouraged to contact clinic should she have any questions or concerns prior to her return visit.   Follow up plan: - Return in about 6 months (around 12/15/2021) for F/U with Pre-visit Labs, Meter, Logs, A1c here.Glade Lloyd, MD [facility] [address] [place], [place] [address] Phone: [phone]  Fax: [phone]    06/17/2021, 3:21 PM  This note was partially dictated with voice recognition software. Similar sounding words can be transcribed inadequately or may not  be corrected upon review.

## 2021-06-17 NOTE — Patient Instructions (Signed)

## 2021-12-04 ENCOUNTER — Telehealth: Payer: Self-pay | Admitting: "Endocrinology

## 2021-12-04 DIAGNOSIS — E119 Type 2 diabetes mellitus without complications: Secondary | ICD-10-CM

## 2021-12-04 MED ORDER — TRULICITY 3 MG/0.5ML ~~LOC~~ SOAJ: 3.0000 mg | SUBCUTANEOUS | 0 refills | Status: DC

## 2021-12-04 NOTE — Telephone Encounter (Signed)
Rx refill sent.

## 2021-12-04 NOTE — Telephone Encounter (Signed)
Patient is asking for a refill on her Trulicity. Please send to [facility] on [address]

## 2021-12-17 ENCOUNTER — Ambulatory Visit: Payer: BC Managed Care – PPO | Admitting: "Endocrinology

## 2021-12-24 LAB — COMPREHENSIVE METABOLIC PANEL
ALT: 16 IU/L (ref 0–32)
AST: 16 IU/L (ref 0–40)
Albumin/Globulin Ratio: 1 — ABNORMAL LOW (ref 1.2–2.2)
Albumin: 3.9 g/dL (ref 3.8–4.9)
Alkaline Phosphatase: 71 IU/L (ref 44–121)
BUN/Creatinine Ratio: 21 (ref 9–23)
BUN: 19 mg/dL (ref 6–24)
Bilirubin Total: 0.2 mg/dL (ref 0.0–1.2)
CO2: 23 mmol/L (ref 20–29)
Calcium: 9.6 mg/dL (ref 8.7–10.2)
Chloride: 101 mmol/L (ref 96–106)
Creatinine, Ser: 0.9 mg/dL (ref 0.57–1.00)
Globulin, Total: 4 g/dL (ref 1.5–4.5)
Glucose: 104 mg/dL — ABNORMAL HIGH (ref 70–99)
Potassium: 4.4 mmol/L (ref 3.5–5.2)
Sodium: 137 mmol/L (ref 134–144)
Total Protein: 7.9 g/dL (ref 6.0–8.5)
eGFR: 76 mL/min/{1.73_m2} (ref >59)

## 2021-12-24 LAB — LIPID PANEL
Chol/HDL Ratio: 3.6 ratio (ref 0.0–4.4)
Cholesterol, Total: 166 mg/dL (ref 100–199)
HDL: 46 mg/dL (ref >39)
LDL Chol Calc (NIH): 101 mg/dL — ABNORMAL HIGH (ref 0–99)
Triglycerides: 102 mg/dL (ref 0–149)
VLDL Cholesterol Cal: 19 mg/dL (ref 5–40)

## 2021-12-24 LAB — T4, FREE: Free T4: 1.24 ng/dL (ref 0.82–1.77)

## 2021-12-24 LAB — TSH: TSH: 0.766 u[IU]/mL (ref 0.450–4.500)

## 2021-12-27 ENCOUNTER — Ambulatory Visit: Payer: BC Managed Care – PPO | Admitting: "Endocrinology

## 2021-12-27 ENCOUNTER — Encounter: Payer: Self-pay | Admitting: "Endocrinology

## 2021-12-27 VITALS — BP 106/76 | HR 96 | Ht 64.0 in | Wt 206.2 lb

## 2021-12-27 DIAGNOSIS — E782 Mixed hyperlipidemia: Secondary | ICD-10-CM

## 2021-12-27 DIAGNOSIS — E559 Vitamin D deficiency, unspecified: Secondary | ICD-10-CM | POA: Diagnosis not present

## 2021-12-27 DIAGNOSIS — E66812 Obesity, class 2: Secondary | ICD-10-CM | POA: Insufficient documentation

## 2021-12-27 DIAGNOSIS — I10 Essential (primary) hypertension: Secondary | ICD-10-CM

## 2021-12-27 DIAGNOSIS — Z6835 Body mass index (BMI) 35.0-35.9, adult: Secondary | ICD-10-CM

## 2021-12-27 DIAGNOSIS — E119 Type 2 diabetes mellitus without complications: Secondary | ICD-10-CM

## 2021-12-27 LAB — POCT GLYCOSYLATED HEMOGLOBIN (HGB A1C): HbA1c, POC (controlled diabetic range): 7.2 % — AB (ref 0.0–7.0)

## 2021-12-27 NOTE — Progress Notes (Unsigned)
12/27/2021, 12:34 PM  Endocrinology follow-up note   Subjective:    Patient ID: Rachel Stewart, female    DOB: 11/16/1970.  UBAH RADKE is being seen in follow-up after she was seen in consultation for management of currently uncontrolled symptomatic diabetes requested by  Eulogio Ditch, NP.   Past Medical History:  Diagnosis Date   Bronchitis, chronic ([place])    last tx. 8'17   Diabetes mellitus without complication ([place])    GERD (gastroesophageal reflux disease)    Hypertension    Neuromuscular disorder ([place])    "tic pain" left face near ear area. Carpal tunnel bilateral. Arthritis knee.   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/2016   Procedure: LAPAROSCOPIC ROUX-EN-Y GASTRIC BYPASS WITH HIATAL HERNIA REPAIR, UPPER ENDO;  Surgeon: Johnathan Hausen, MD;  Location: [facility];  Service: General;  Laterality: N/A;   SPINE SURGERY     lumbar decompression    Social History   Socioeconomic History   Marital status: Single    Spouse name: Not on file   Number of children: Not on file   Years of education: Not on file   Highest education level: Not on file  Occupational History   Not on file  Tobacco Use   Smoking status: Never   Smokeless tobacco: Never  Vaping Use   Vaping Use: Never used  Substance and Sexual Activity   Alcohol use: No   Drug use: No   Sexual activity: Yes  Other Topics Concern   Not on file  Social History Narrative   Not on file   Social Determinants of Health   Financial Resource Strain: Not on file  Food Insecurity: Not on file  Transportation Needs: Not on file  Physical Activity: Not on file  Stress: Not on file  Social Connections: Not on file    Family History  Problem Relation Age of Onset   Aneurysm Mother    Diabetes Mother     Hypertension Sister     Outpatient Encounter Medications as of 12/27/2021  Medication Sig   Accu-Chek Softclix Lancets lancets Use as instructed to test blood glucose two times daily. Once in the morning and once at bedtime.   Blood Glucose Monitoring Suppl (ACCU-CHEK GUIDE) w/Device KIT Use to test blood glucose two times daily. Once in the morning and once at bedtime.   Cholecalciferol (VITAMIN D3) 125 MCG (5000 UT) CAPS Take 1 capsule (5,000 Units total) by mouth daily.   cyanocobalamin (,VITAMIN B-12,) 1000 MCG/ML injection Inject 1,000 mcg into the muscle every 30 (thirty) days.   Dulaglutide (TRULICITY) 3 EY/8.1KG SOPN Inject 3 mg as directed once a week.   FERRETTS 325 (106 Fe) MG TABS tablet Take 1 tablet by mouth daily.   glipiZIDE (GLUCOTROL XL) 5 MG 24 hr tablet TAKE 1 TABLET BY MOUTH EVERY DAY WITH BREAKFAST   glucose blood (ACCU-CHEK GUIDE) test strip Use as instructed to check blood glucose two times daily. Once in the morning and once at bedtime.   glucose blood test strip Use as instructed  -1x a day -One Touch Ultra   hydrOXYzine (ATARAX/VISTARIL) 10 MG tablet Take 10 mg by mouth 3 (three) times daily as needed.   losartan (COZAAR) 50 MG tablet  Take 50 mg by mouth daily.   Multiple Vitamin (MULTIVITAMIN WITH MINERALS) TABS tablet Take 1 tablet by mouth daily.   omeprazole (PRILOSEC) 40 MG capsule Take 40 mg by mouth every morning.   phentermine (ADIPEX-P) 37.5 MG tablet Take 37.5 mg by mouth every morning.   No facility-administered encounter medications on file as of 12/27/2021.    ALLERGIES: Allergies  Allergen Reactions   Lisinopril Cough    VACCINATION STATUS:  There is no immunization history on file for this patient.  Diabetes She presents for her follow-up diabetic visit. She has type 2 diabetes mellitus. Onset time: She was diagnosed at approximate age of 52 years. Her disease course has been improving. There are no hypoglycemic associated symptoms.  Pertinent negatives for hypoglycemia include no confusion, headaches, pallor or seizures. Associated symptoms include fatigue. Pertinent negatives for diabetes include no chest pain, no polydipsia, no polyphagia and no polyuria. There are no hypoglycemic complications. Symptoms are improving. There are no diabetic complications. Risk factors for coronary artery disease include diabetes mellitus, hypertension, obesity and sedentary lifestyle. Current diabetic treatments: She is currently oSaxenda 3 mg daily, glipizide 5 mg p.o. daily. Her weight is fluctuating minimally (Patient with history of moderate obesity, weight up to 275 pounds before she underwent Roux-en-Y gastric bypass in 2006 which allowed her to lose more than 100 pounds, however, more recently she is gaining.). She is following a generally unhealthy diet. When asked about meal planning, she reported none. She has not had a previous visit with a dietitian. She participates in exercise intermittently. (She did not bring any logs nor meter.  Her point-of-care A1c is 7.2%, improving from 8.9%.  She denies hypoglycemia.  Her A1c has been as high as 9.9% prior to 2 visits.  ) An ACE inhibitor/angiotensin II receptor blocker is being taken. Eye exam is current.  Hypertension This is a chronic problem. The current episode started more than 1 year ago. The problem is controlled. Pertinent negatives include no chest pain, headaches, palpitations or shortness of breath. Risk factors for coronary artery disease include diabetes mellitus, sedentary lifestyle and obesity. Past treatments include angiotensin blockers.    Review of Systems  Constitutional:  Positive for fatigue. Negative for chills, fever and unexpected weight change.  HENT:  Negative for trouble swallowing and voice change.   Eyes:  Negative for visual disturbance.  Respiratory:  Negative for cough, shortness of breath and wheezing.   Cardiovascular:  Negative for chest pain, palpitations  and leg swelling.  Gastrointestinal:  Negative for diarrhea, nausea and vomiting.  Endocrine: Negative for cold intolerance, heat intolerance, polydipsia, polyphagia and polyuria.  Musculoskeletal:  Negative for arthralgias and myalgias.  Skin:  Negative for color change, pallor, rash and wound.  Neurological:  Negative for seizures and headaches.  Psychiatric/Behavioral:  Negative for confusion and suicidal ideas.     Objective:       12/27/2021   10:55 AM 06/17/2021    2:38 PM 03/06/2021    9:27 AM  Vitals with BMI  Height 5' 4"  5' 4"  5' 4"   Weight 206 lbs 3 oz 208 lbs 13 oz 205 lbs 6 oz  BMI 35.38 88.28 00.34  Systolic 917 915 056  Diastolic 76 84 86  Pulse 96 88 80    BP 106/76   Pulse 96   Ht 5' 4"  (1.626 m)   Wt 206 lb 3.2 oz (93.5 kg)   BMI 35.39 kg/m   Wt Readings from Last 3 Encounters:  12/27/21  206 lb 3.2 oz (93.5 kg)  06/17/21 208 lb 12.8 oz (94.7 kg)  03/06/21 205 lb 6.4 oz (93.2 kg)         CMP ( most recent) CMP     Component Value Date/Time   NA 137 12/23/2021 1340   K 4.4 12/23/2021 1340   CL 101 12/23/2021 1340   CO2 23 12/23/2021 1340   GLUCOSE 104 (H) 12/23/2021 1340   GLUCOSE 158 (H) 06/18/2018 1608   BUN 19 12/23/2021 1340   CREATININE 0.90 12/23/2021 1340   CREATININE 0.76 06/18/2018 1608   CALCIUM 9.6 12/23/2021 1340   PROT 7.9 12/23/2021 1340   ALBUMIN 3.9 12/23/2021 1340   AST 16 12/23/2021 1340   ALT 16 12/23/2021 1340   ALKPHOS 71 12/23/2021 1340   BILITOT 0.2 12/23/2021 1340   GFRNONAA >60 08/15/2019 0000   GFRNONAA 92 06/18/2018 1608   GFRAA >60 08/15/2019 0000   GFRAA 107 06/18/2018 1608     Diabetic Labs (most recent): Lab Results  Component Value Date   HGBA1C 7.2 (A) 12/27/2021   HGBA1C 8.9 (A) 06/17/2021   HGBA1C 9.9 (A) 03/06/2021   MICROALBUR 1.6 06/18/2018     Lipid Panel ( most recent) Lipid Panel     Component Value Date/Time   CHOL 166 12/23/2021 1340   TRIG 102 12/23/2021 1340   HDL 46 12/23/2021  1340   CHOLHDL 3.6 12/23/2021 1340   CHOLHDL 4 06/18/2018 1608   VLDL 25.4 06/18/2018 1608   LDLCALC 101 (H) 12/23/2021 1340   LABVLDL 19 12/23/2021 1340      Lab Results  Component Value Date   TSH 0.766 12/23/2021   TSH 0.957 02/26/2021   TSH 0.836 08/16/2020   TSH 0.93 08/15/2019   FREET4 1.24 12/23/2021   FREET4 1.23 02/26/2021   FREET4 1.30 08/16/2020      Assessment & Plan:   1. Type 2 diabetes mellitus without complication, without long-term current use of insulin (HCC)  - Kennley N Hinger has currently uncontrolled symptomatic type 2 DM since  51 years of age.  She did not bring any logs nor meter.  Her point-of-care A1c is 7.2%, improving from 8.9%.  She denies hypoglycemia.  Her A1c has been as high as 9.9% prior to 2 visits.    - I had a long discussion with her about the progressive nature of diabetes and the pathology behind its complications. -her diabetes is complicated by obesity and she remains at a high risk for more acute and chronic complications which include CAD, CVA, CKD, retinopathy, and neuropathy. These are all discussed in detail with her.  - I have counseled her on diet  and weight management  by adopting a carbohydrate restricted/protein rich diet. Patient is encouraged to switch to  unprocessed or minimally processed     complex starch and increased protein intake (animal or plant source), fruits, and vegetables. -  she is advised to stick to a routine mealtimes to eat 3 meals  a day and avoid unnecessary snacks ( to snack only to correct hypoglycemia).  - she acknowledges that there is a room for improvement in her food and drink choices. - Suggestion is made for her to avoid simple carbohydrates  from her diet including Cakes, Sweet Desserts, Ice Cream, Soda (diet and regular), Sweet Tea, Candies, Chips, Cookies, Store Bought Juices, Alcohol , Artificial Sweeteners,  Coffee Creamer, and "Sugar-free" Products, Lemonade. This will help patient to  have more stable blood glucose profile and  potentially avoid unintended weight gain.  The following Lifestyle Medicine recommendations according to [place]  [facility]) were discussed and and offered to patient and she  agrees to start the journey:  A. Whole Foods, Plant-Based Nutrition comprising of fruits and vegetables, plant-based proteins, whole-grain carbohydrates was discussed in detail with the patient.   A list for source of those nutrients were also provided to the patient.  Patient will use only water or unsweetened tea for hydration. B.  The need to stay away from risky substances including alcohol, smoking; obtaining 7 to 9 hours of restorative sleep, at least 150 minutes of moderate intensity exercise weekly, the importance of healthy social connections,  and stress management techniques were discussed. C.  A full color page of  Calorie density of various food groups per pound showing examples of each food groups was provided to the patient.  - she has been scheduled with Jearld Fenton, RDN, CDE for diabetes education.  - I have approached her with the following individualized plan to manage  her diabetes and patient agrees:   -In light of her presentation with controlled A1c of 7.2%, she is advised to continue Trulicity 3 mg weekly, glipizide 5 mg XL p.o. daily at breakfast.  She is urged to monitor blood glucose at least once a day daily before breakfast.  She will not need insulin treatment for now.    - she is encouraged to call clinic for blood glucose levels less than 70 or above 200 mg /dl.   - Specific targets for  A1c;  LDL, HDL,  and Triglycerides were discussed with the patient.  2) Blood Pressure /Hypertension: .  Her blood pressure is controlled to target. she is advised to continue her current medications including losartan 50 mg p.o. daily with breakfast .  3) Lipids/Hyperlipidemia:   Her facility panel shows LDL at 101.  She is not on  statins.  Lifestyle medicine described above will help control lipids.    She will like to wait until her next lipid panel before considering statin medications.    4)  Weight/Diet:  Body mass index is 35.39 kg/m.  -   clearly complicating her diabetes care.   she is  a candidate for weight loss. I discussed with her the fact that loss of 5 - 10% of her  current body weight will have the most impact on her diabetes management.  Exercise, and detailed carbohydrates information provided  -  detailed on discharge instructions.  Patient has lost more than 100 pounds with her to her last gastric bypass, currently retaining.  5) Chronic Care/Health Maintenance:  -she  is on ARB medications and  is encouraged to initiate and continue to follow up with Ophthalmology, Dentist,  Podiatrist at least yearly or according to recommendations, and advised to   stay away from smoking. I have recommended yearly flu vaccine and pneumonia vaccine at least every 5 years; moderate intensity exercise for up to 150 minutes weekly; and  sleep for at least 7 hours a day. Her screening ABI was normal in March 2022.  Her study will be repeated in March 2027 or sooner if needed.   - she is  advised to maintain close follow up with Eulogio Ditch, NP for primary care needs, as well as her other providers for optimal and coordinated care.    I spent 31 minutes in the care of the patient today including review of labs from CMP, Lipids, Thyroid Function,  Hematology (current and previous including abstractions from other facilities); face-to-face time discussing  her blood glucose readings/logs, discussing hypoglycemia and hyperglycemia episodes and symptoms, medications doses, her options of short and long term treatment based on the latest standards of care / guidelines;  discussion about incorporating lifestyle medicine;  and documenting the encounter. Risk reduction counseling performed per USPSTF guidelines to reduce obesity and  cardiovascular risk factors.     Please refer to Patient Instructions for Blood Glucose Monitoring and Insulin/Medications Dosing Guide"  in media tab for additional information. Please  also refer to " Patient Self Inventory" in the Media  tab for reviewed elements of pertinent patient history.  Rachel Stewart participated in the discussions, expressed understanding, and voiced agreement with the above plans.  All questions were answered to her satisfaction. she is encouraged to contact clinic should she have any questions or concerns prior to her return visit.    Follow up plan: - Return in about 6 months (around 06/29/2022) for Bring Meter/CGM Device/Logs- A1c in Office.  Glade Lloyd, MD [facility] [address] [place], [place] [address] Phone: [phone]  Fax: [phone]    12/27/2021, 12:34 PM  This note was partially dictated with voice recognition software. Similar sounding words can be transcribed inadequately or may not  be corrected upon review.

## 2021-12-27 NOTE — Patient Instructions (Signed)

## 2022-06-30 ENCOUNTER — Ambulatory Visit: Payer: BC Managed Care – PPO | Admitting: "Endocrinology

## 2022-07-17 LAB — COMPREHENSIVE METABOLIC PANEL
ALT: 27 IU/L (ref 0–32)
AST: 17 IU/L (ref 0–40)
Albumin/Globulin Ratio: 0.9 — ABNORMAL LOW (ref 1.2–2.2)
Albumin: 3.5 g/dL — ABNORMAL LOW (ref 3.8–4.9)
Alkaline Phosphatase: 98 IU/L (ref 44–121)
BUN/Creatinine Ratio: 15 (ref 9–23)
BUN: 15 mg/dL (ref 6–24)
Bilirubin Total: 0.3 mg/dL (ref 0.0–1.2)
CO2: 23 mmol/L (ref 20–29)
Calcium: 9.4 mg/dL (ref 8.7–10.2)
Chloride: 100 mmol/L (ref 96–106)
Creatinine, Ser: 0.97 mg/dL (ref 0.57–1.00)
Globulin, Total: 3.9 g/dL (ref 1.5–4.5)
Glucose: 287 mg/dL — ABNORMAL HIGH (ref 70–99)
Potassium: 4.5 mmol/L (ref 3.5–5.2)
Sodium: 136 mmol/L (ref 134–144)
Total Protein: 7.4 g/dL (ref 6.0–8.5)
eGFR: 70 mL/min/{1.73_m2} (ref >59)

## 2022-07-17 LAB — LIPID PANEL
Chol/HDL Ratio: 3.6 ratio (ref 0.0–4.4)
Cholesterol, Total: 167 mg/dL (ref 100–199)
HDL: 47 mg/dL (ref >39)
LDL Chol Calc (NIH): 87 mg/dL (ref 0–99)
Triglycerides: 194 mg/dL — ABNORMAL HIGH (ref 0–149)
VLDL Cholesterol Cal: 33 mg/dL (ref 5–40)

## 2022-07-17 LAB — VITAMIN D 25 HYDROXY (VIT D DEFICIENCY, FRACTURES): Vit D, 25-Hydroxy: 16.6 ng/mL — ABNORMAL LOW (ref 30.0–100.0)

## 2022-07-21 ENCOUNTER — Other Ambulatory Visit: Payer: Self-pay

## 2022-07-21 DIAGNOSIS — E119 Type 2 diabetes mellitus without complications: Secondary | ICD-10-CM

## 2022-07-21 MED ORDER — TRULICITY 3 MG/0.5ML ~~LOC~~ SOAJ: 3.0000 mg | SUBCUTANEOUS | 0 refills | Status: DC

## 2022-07-22 ENCOUNTER — Ambulatory Visit: Payer: BC Managed Care – PPO | Admitting: "Endocrinology

## 2022-08-06 ENCOUNTER — Encounter: Payer: Self-pay | Admitting: "Endocrinology

## 2022-08-06 ENCOUNTER — Ambulatory Visit: Payer: BC Managed Care – PPO | Admitting: "Endocrinology

## 2022-08-06 VITALS — BP 104/72 | HR 96 | Ht 64.0 in | Wt 206.8 lb

## 2022-08-06 DIAGNOSIS — E559 Vitamin D deficiency, unspecified: Secondary | ICD-10-CM

## 2022-08-06 DIAGNOSIS — Z6835 Body mass index (BMI) 35.0-35.9, adult: Secondary | ICD-10-CM

## 2022-08-06 DIAGNOSIS — E782 Mixed hyperlipidemia: Secondary | ICD-10-CM | POA: Diagnosis not present

## 2022-08-06 DIAGNOSIS — I10 Essential (primary) hypertension: Secondary | ICD-10-CM

## 2022-08-06 DIAGNOSIS — E119 Type 2 diabetes mellitus without complications: Secondary | ICD-10-CM | POA: Diagnosis not present

## 2022-08-06 LAB — POCT GLYCOSYLATED HEMOGLOBIN (HGB A1C): HbA1c, POC (controlled diabetic range): 8.6 % — AB (ref 0.0–7.0)

## 2022-08-06 MED ORDER — TRULICITY 4.5 MG/0.5ML ~~LOC~~ SOAJ: 4.5000 mg | SUBCUTANEOUS | 3 refills | Status: DC

## 2022-08-06 MED ORDER — ACCU-CHEK GUIDE VI STRP: ORAL_STRIP | 2 refills | Status: AC

## 2022-08-06 MED ORDER — ACCU-CHEK GUIDE W/DEVICE KIT: PACK | 0 refills | Status: AC

## 2022-08-06 NOTE — Progress Notes (Signed)
08/06/2022, 6:47 PM  Endocrinology follow-up note   Subjective:    Patient ID: Rachel Stewart, female    DOB: 09/07/1970.  Rachel Stewart is being seen in follow-up after she was seen in consultation for management of currently uncontrolled symptomatic diabetes requested by  Eulogio Ditch, NP.   Past Medical History:  Diagnosis Date   Bronchitis, chronic ([place])    last tx. 8'17   Diabetes mellitus without complication ([place])    GERD (gastroesophageal reflux disease)    Hypertension    Neuromuscular disorder ([place])    "tic pain" left face near ear area. Carpal tunnel bilateral. Arthritis knee.   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/2016   Procedure: LAPAROSCOPIC ROUX-EN-Y GASTRIC BYPASS WITH HIATAL HERNIA REPAIR, UPPER ENDO;  Surgeon: Johnathan Hausen, MD;  Location: [facility];  Service: General;  Laterality: N/A;   SPINE SURGERY     lumbar decompression    Social History   Socioeconomic History   Marital status: Single    Spouse name: Not on file   Number of children: Not on file   Years of education: Not on file   Highest education level: Not on file  Occupational History   Not on file  Tobacco Use   Smoking status: Never   Smokeless tobacco: Never  Vaping Use   Vaping Use: Never used  Substance and Sexual Activity   Alcohol use: No   Drug use: No   Sexual activity: Yes  Other Topics Concern   Not on file  Social History Narrative   Not on file   Social Determinants of Health   Financial Resource Strain: Not on file  Food Insecurity: Not on file  Transportation Needs: Not on file  Physical Activity: Not on file  Stress: Not on file  Social Connections: Not on file    Family History  Problem Relation Age of Onset   Aneurysm Mother    Diabetes Mother     Hypertension Sister     Outpatient Encounter Medications as of 08/06/2022  Medication Sig   Dulaglutide (TRULICITY) 4.5 0000000 SOPN Inject 4.5 mg as directed once a week.   glucose blood (ACCU-CHEK GUIDE) test strip Use as instructed   Accu-Chek Softclix Lancets lancets Use as instructed to test blood glucose two times daily. Once in the morning and once at bedtime.   Blood Glucose Monitoring Suppl (ACCU-CHEK GUIDE) w/Device KIT Use to test blood glucose two times daily. Once in the morning and once at bedtime.   carvedilol (COREG) 6.25 MG tablet Take 1 tablet by mouth 2 (two) times daily with a meal.   Cholecalciferol (VITAMIN D3) 125 MCG (5000 UT) CAPS Take 1 capsule (5,000 Units total) by mouth daily.   cyanocobalamin (,VITAMIN B-12,) 1000 MCG/ML injection Inject 1,000 mcg into the muscle every 30 (thirty) days.   FERRETTS 325 (106 Fe) MG TABS tablet Take 2 tablets by mouth daily.   glipiZIDE (GLUCOTROL XL) 5 MG 24 hr tablet TAKE 1 TABLET BY MOUTH EVERY DAY WITH BREAKFAST   hydrOXYzine (ATARAX/VISTARIL) 10 MG tablet Take 10 mg by mouth 3 (three) times daily as needed.   losartan (COZAAR) 50 MG tablet Take 50 mg by mouth daily.   Multiple Vitamin (MULTIVITAMIN WITH  MINERALS) TABS tablet Take 1 tablet by mouth daily.   omeprazole (PRILOSEC) 40 MG capsule Take 40 mg by mouth every morning.   phentermine (ADIPEX-P) 37.5 MG tablet Take 37.5 mg by mouth every morning.   [DISCONTINUED] Blood Glucose Monitoring Suppl (ACCU-CHEK GUIDE) w/Device KIT Use to test blood glucose two times daily. Once in the morning and once at bedtime.   [DISCONTINUED] Dulaglutide (TRULICITY) 3 0000000 SOPN Inject 3 mg as directed once a week.   [DISCONTINUED] glucose blood (ACCU-CHEK GUIDE) test strip Use as instructed to check blood glucose two times daily. Once in the morning and once at bedtime.   [DISCONTINUED] glucose blood test strip Use as instructed  -1x a day -One Touch Ultra   No facility-administered  encounter medications on file as of 08/06/2022.    ALLERGIES: Allergies  Allergen Reactions   Lisinopril Cough    VACCINATION STATUS: Immunization History  Administered Date(s) Administered   Moderna Sars-Covid-2 Vaccination 09/14/2019, 10/12/2019    Diabetes She presents for her follow-up diabetic visit. She has type 2 diabetes mellitus. Onset time: She was diagnosed at approximate age of 52 years. Her disease course has been worsening. There are no hypoglycemic associated symptoms. Pertinent negatives for hypoglycemia include no confusion, headaches, pallor or seizures. Associated symptoms include fatigue. Pertinent negatives for diabetes include no chest pain, no polydipsia, no polyphagia and no polyuria. There are no hypoglycemic complications. Symptoms are worsening. There are no diabetic complications. Risk factors for coronary artery disease include diabetes mellitus, hypertension, obesity and sedentary lifestyle. Current diabetic treatments: She is currently oSaxenda 3 mg daily, glipizide 5 mg p.o. daily. Her weight is fluctuating minimally (Patient with history of moderate obesity, weight up to 275 pounds before she underwent Roux-en-Y gastric bypass in 2006 which allowed her to lose more than 100 pounds, however, more recently she is gaining.). She is following a generally unhealthy diet. When asked about meal planning, she reported none. She has not had a previous visit with a dietitian. She participates in exercise intermittently. (As usual, she did not bring any logs nor meter with her.  Her point-of-care A1c is 8.6%, increasing from 7.2%.  She denies any hypoglycemic episodes.  ) An ACE inhibitor/angiotensin II receptor blocker is being taken. Eye exam is current.  Hypertension This is a chronic problem. The current episode started more than 1 year ago. The problem is controlled. Pertinent negatives include no chest pain, headaches, palpitations or shortness of breath. Risk factors for  coronary artery disease include diabetes mellitus, sedentary lifestyle and obesity. Past treatments include angiotensin blockers.    Review of Systems  Constitutional:  Positive for fatigue. Negative for chills, fever and unexpected weight change.  HENT:  Negative for trouble swallowing and voice change.   Eyes:  Negative for visual disturbance.  Respiratory:  Negative for cough, shortness of breath and wheezing.   Cardiovascular:  Negative for chest pain, palpitations and leg swelling.  Gastrointestinal:  Negative for diarrhea, nausea and vomiting.  Endocrine: Negative for cold intolerance, heat intolerance, polydipsia, polyphagia and polyuria.  Musculoskeletal:  Negative for arthralgias and myalgias.  Skin:  Negative for color change, pallor, rash and wound.  Neurological:  Negative for seizures and headaches.  Psychiatric/Behavioral:  Negative for confusion and suicidal ideas.     Objective:       08/06/2022    3:51 PM 12/27/2021   10:55 AM 06/17/2021    2:38 PM  Vitals with BMI  Height '5\' 4"'$  '5\' 4"'$  '5\' 4"'$   Weight  206 lbs 13 oz 206 lbs 3 oz 208 lbs 13 oz  BMI 35.48 123456 [id number]  Systolic 123456 [id number]  Diastolic 72 76 84  Pulse 96 96 88    BP 104/72   Pulse 96   Ht '5\' 4"'$  (1.626 m)   Wt 206 lb 12.8 oz (93.8 kg)   BMI 35.50 kg/m   Wt Readings from Last 3 Encounters:  08/06/22 206 lb 12.8 oz (93.8 kg)  12/27/21 206 lb 3.2 oz (93.5 kg)  06/17/21 208 lb 12.8 oz (94.7 kg)     CMP ( most recent) CMP     Component Value Date/Time   NA 136 07/16/2022 1436   K 4.5 07/16/2022 1436   CL 100 07/16/2022 1436   CO2 23 07/16/2022 1436   GLUCOSE 287 (H) 07/16/2022 1436   GLUCOSE 158 (H) 06/18/2018 1608   BUN 15 07/16/2022 1436   CREATININE 0.97 07/16/2022 1436   CREATININE 0.76 06/18/2018 1608   CALCIUM 9.4 07/16/2022 1436   PROT 7.4 07/16/2022 1436   ALBUMIN 3.5 (L) 07/16/2022 1436   AST 17 07/16/2022 1436   ALT 27 07/16/2022 1436   ALKPHOS 98 07/16/2022 1436   BILITOT 0.3  07/16/2022 1436   GFRNONAA >60 08/15/2019 0000   GFRNONAA 92 06/18/2018 1608   GFRAA >60 08/15/2019 0000   GFRAA 107 06/18/2018 1608     Diabetic Labs (most recent): Lab Results  Component Value Date   HGBA1C 8.6 (A) 08/06/2022   HGBA1C 7.2 (A) 12/27/2021   HGBA1C 8.9 (A) 06/17/2021   MICROALBUR 1.6 06/18/2018     Lipid Panel ( most recent) Lipid Panel     Component Value Date/Time   CHOL 167 07/16/2022 1436   TRIG 194 (H) 07/16/2022 1436   HDL 47 07/16/2022 1436   CHOLHDL 3.6 07/16/2022 1436   CHOLHDL 4 06/18/2018 1608   VLDL 25.4 06/18/2018 1608   LDLCALC 87 07/16/2022 1436   LABVLDL 33 07/16/2022 1436      Lab Results  Component Value Date   TSH 0.766 12/23/2021   TSH 0.957 02/26/2021   TSH 0.836 08/16/2020   TSH 0.93 08/15/2019   FREET4 1.24 12/23/2021   FREET4 1.23 02/26/2021   FREET4 1.30 08/16/2020      Assessment & Plan:   1. Type 2 diabetes mellitus without complication, without long-term current use of insulin ([facility])  - Rachel Stewart has currently uncontrolled symptomatic type 2 DM since  52 years of age.  As usual, she did not bring any logs nor meter with her.  Her point-of-care A1c is 8.6%, increasing from 7.2%.  She denies any hypoglycemic episodes.    - I had a long discussion with her about the progressive nature of diabetes and the pathology behind its complications. -her diabetes is complicated by obesity and she remains at a high risk for more acute and chronic complications which include CAD, CVA, CKD, retinopathy, and neuropathy. These are all discussed in detail with her.  - I have counseled her on diet  and weight management  by adopting a carbohydrate restricted/protein rich diet. Patient is encouraged to switch to  unprocessed or minimally processed     complex starch and increased protein intake (animal or plant source), fruits, and vegetables. -  she is advised to stick to a routine mealtimes to eat 3 meals  a day and avoid  unnecessary snacks ( to snack only to correct hypoglycemia).   - she acknowledges that there is a room  for improvement in her food and drink choices. - Suggestion is made for her to avoid simple carbohydrates  from her diet including Cakes, Sweet Desserts, Ice Cream, Soda (diet and regular), Sweet Tea, Candies, Chips, Cookies, Store Bought Juices, Alcohol , Artificial Sweeteners,  Coffee Creamer, and "Sugar-free" Products, Lemonade. This will help patient to have more stable blood glucose profile and potentially avoid unintended weight gain.  The following Lifestyle Medicine recommendations according to [place]  [facility]) were discussed and and offered to patient and she  agrees to start the journey:  A. Whole Foods, Plant-Based Nutrition comprising of fruits and vegetables, plant-based proteins, whole-grain carbohydrates was discussed in detail with the patient.   A list for source of those nutrients were also provided to the patient.  Patient will use only water or unsweetened tea for hydration. B.  The need to stay away from risky substances including alcohol, smoking; obtaining 7 to 9 hours of restorative sleep, at least 150 minutes of moderate intensity exercise weekly, the importance of healthy social connections,  and stress management techniques were discussed. C.  A full color page of  Calorie density of various food groups per pound showing examples of each food groups was provided to the patient.   - she has been scheduled with Jearld Fenton, RDN, CDE for diabetes education.  - I have approached her with the following individualized plan to manage  her diabetes and patient agrees:   -In light of her presentation with loss of control of her diabetes, she is approached for higher dose of Trulicity at 4.5 mg subcutaneously weekly.  She is encouraged to stay on glipizide 5 mg XL p.o. daily at breakfast.   She is approached and she agrees to monitor blood glucose  twice a day-daily before breakfast and at bedtime.    - she is encouraged to call clinic for blood glucose levels less than 70 or above 150 mg daily at on 3 days a week fasting.    - Specific targets for  A1c;  LDL, HDL,  and Triglycerides were discussed with the patient.  2) Blood Pressure /Hypertension: Her blood pressure is controlled to target.  she is advised to continue her current medications including losartan 50 mg p.o. daily with breakfast .  3) Lipids/Hyperlipidemia:   Her previsit lipid panel showed controlled LDL at 87, improving from 1 1.  She is on statin prescribed by her PCP.    Lifestyle medicine described above will help control lipids.     4)  Weight/Diet:  Body mass index is 35.5 kg/m.  -   clearly complicating her diabetes care.   she is  a candidate for weight loss. I discussed with her the fact that loss of 5 - 10% of her  current body weight will have the most impact on her diabetes management.  Exercise, and detailed carbohydrates information provided  -  detailed on discharge instructions.  Patient has lost more than 100 pounds with her to her last gastric bypass, currently retaining.  5) Chronic Care/Health Maintenance:  -she  is on ARB medications and  is encouraged to initiate and continue to follow up with Ophthalmology, Dentist,  Podiatrist at least yearly or according to recommendations, and advised to   stay away from smoking. I have recommended yearly flu vaccine and pneumonia vaccine at least every 5 years; moderate intensity exercise for up to 150 minutes weekly; and  sleep for at least 7 hours a day. Her  screening ABI was normal in March 2022.  Her study will be repeated in March 2027 or sooner if needed.   - she is  advised to maintain close follow up with Eulogio Ditch, NP for primary care needs, as well as her other providers for optimal and coordinated care.   I spent  26  minutes in the care of the patient today including review of labs from [place],  Lipids, Thyroid Function, Hematology (current and previous including abstractions from other facilities); face-to-face time discussing  her blood glucose readings/logs, discussing hypoglycemia and hyperglycemia episodes and symptoms, medications doses, her options of short and long term treatment based on the latest standards of care / guidelines;  discussion about incorporating lifestyle medicine;  and documenting the encounter. Risk reduction counseling performed per USPSTF guidelines to reduce  obesity and cardiovascular risk factors.     Please refer to Patient Instructions for Blood Glucose Monitoring and Insulin/Medications Dosing Guide"  in media tab for additional information. Please  also refer to " Patient Self Inventory" in the Media  tab for reviewed elements of pertinent patient history.  Rachel Stewart participated in the discussions, expressed understanding, and voiced agreement with the above plans.  All questions were answered to her satisfaction. she is encouraged to contact clinic should she have any questions or concerns prior to her return visit.     Follow up plan: - Return in about 4 months (around 12/05/2022) for Bring Meter/CGM Device/Logs- A1c in Office.  Glade Lloyd, MD [facility] [address] [place], [place] [address] Phone: [phone]  Fax: [phone]    08/06/2022, 6:47 PM  This note was partially dictated with voice recognition software. Similar sounding words can be transcribed inadequately or may not  be corrected upon review.

## 2022-08-06 NOTE — Patient Instructions (Signed)
                                     Advice for Weight Management  -For most of us the best way to lose weight is by diet management. Generally speaking, diet management means consuming less calories intentionally which over time brings about progressive weight loss.  This can be achieved more effectively by avoiding ultra processed carbohydrates, processed meats, unhealthy fats.    It is critically important to know your numbers: how much calorie you are consuming and how much calorie you need. More importantly, our carbohydrates sources should be unprocessed naturally occurring  complex starch food items.  It is always important to balance nutrition also by  appropriate intake of proteins (mainly plant-based), healthy fats/oils, plenty of fruits and vegetables.   -The American College of Lifestyle Medicine (ACL M) recommends nutrition derived mostly from Whole Food, Plant Predominant Sources example an apple instead of applesauce or apple pie. Eat Plenty of vegetables, Mushrooms, fruits, Legumes, Whole Grains, Nuts, seeds in lieu of processed meats, processed snacks/pastries red meat, poultry, eggs.  Use only water or unsweetened tea for hydration.  The College also recommends the need to stay away from risky substances including alcohol, smoking; obtaining 7-9 hours of restorative sleep, at least 150 minutes of moderate intensity exercise weekly, importance of healthy social connections, and being mindful of stress and seek help when it is overwhelming.    -Sticking to a routine mealtime to eat 3 meals a day and avoiding unnecessary snacks is shown to have a big role in weight control. Under normal circumstances, the only time we burn stored energy is when we are hungry, so allow  some hunger to take place- hunger means no food between appropriate meal times, only water.  It is not advisable to starve.   -It is better to avoid simple carbohydrates including:  Cakes, Sweet Desserts, Ice Cream, Soda (diet and regular), Sweet Tea, Candies, Chips, Cookies, Store Bought Juices, Alcohol in Excess of  1-2 drinks a day, Lemonade,  Artificial Sweeteners, Doughnuts, Coffee Creamers, "Sugar-free" Products, etc, etc.  This is not a complete list.....    -Consulting with certified diabetes educators is proven to provide you with the most accurate and current information on diet.  Also, you may be  interested in discussing diet options/exchanges , we can schedule a visit with Rachel Stewart, RDN, CDE for individualized nutrition education.  -Exercise: If you are able: 30 -60 minutes a day ,4 days a week, or 150 minutes of moderate intensity exercise weekly.    The longer the better if tolerated.  Combine stretch, strength, and aerobic activities.  If you were told in the past that you have high risk for cardiovascular diseases, or if you are currently symptomatic, you may seek evaluation by your heart doctor prior to initiating moderate to intense exercise programs.                                  Additional Care Considerations for Diabetes/Prediabetes   -Diabetes  is a chronic disease.  The most important care consideration is regular follow-up with your diabetes care provider with the goal being avoiding or delaying its complications and to take advantage of advances in medications and technology.  If appropriate actions are taken early enough, type 2 diabetes can even be   reversed.  Seek information from the right source.  - Whole Food, Plant Predominant Nutrition is highly recommended: Eat Plenty of vegetables, Mushrooms, fruits, Legumes, Whole Grains, Nuts, seeds in lieu of processed meats, processed snacks/pastries red meat, poultry, eggs as recommended by American College of  Lifestyle Medicine (ACLM).  -Type 2 diabetes is known to coexist with other important comorbidities such as high blood pressure and high cholesterol.  It is critical to control not only the  diabetes but also the high blood pressure and high cholesterol to minimize and delay the risk of complications including coronary artery disease, stroke, amputations, blindness, etc.  The good news is that this diet recommendation for type 2 diabetes is also very helpful for managing high cholesterol and high blood blood pressure.  - Studies showed that people with diabetes will benefit from a class of medications known as ACE inhibitors and statins.  Unless there are specific reasons not to be on these medications, the standard of care is to consider getting one from these groups of medications at an optimal doses.  These medications are generally considered safe and proven to help protect the heart and the kidneys.    - People with diabetes are encouraged to initiate and maintain regular follow-up with eye doctors, foot doctors, dentists , and if necessary heart and kidney doctors.     - It is highly recommended that people with diabetes quit smoking or stay away from smoking, and get yearly  flu vaccine and pneumonia vaccine at least every 5 years.  See above for additional recommendations on exercise, sleep, stress management , and healthy social connections.      

## 2022-08-27 LAB — HM DIABETES EYE EXAM

## 2022-08-28 ENCOUNTER — Encounter: Payer: Self-pay | Admitting: "Endocrinology

## 2022-12-15 ENCOUNTER — Ambulatory Visit: Payer: BC Managed Care – PPO | Admitting: "Endocrinology

## 2022-12-15 ENCOUNTER — Encounter: Payer: Self-pay | Admitting: "Endocrinology

## 2022-12-15 VITALS — BP 108/68 | HR 90 | Ht 64.0 in | Wt 203.0 lb

## 2022-12-15 DIAGNOSIS — Z7985 Long-term (current) use of injectable non-insulin antidiabetic drugs: Secondary | ICD-10-CM | POA: Diagnosis not present

## 2022-12-15 DIAGNOSIS — E559 Vitamin D deficiency, unspecified: Secondary | ICD-10-CM | POA: Diagnosis not present

## 2022-12-15 DIAGNOSIS — I10 Essential (primary) hypertension: Secondary | ICD-10-CM | POA: Diagnosis not present

## 2022-12-15 DIAGNOSIS — E119 Type 2 diabetes mellitus without complications: Secondary | ICD-10-CM

## 2022-12-15 DIAGNOSIS — E782 Mixed hyperlipidemia: Secondary | ICD-10-CM

## 2022-12-15 DIAGNOSIS — Z7984 Long term (current) use of oral hypoglycemic drugs: Secondary | ICD-10-CM | POA: Diagnosis not present

## 2022-12-15 DIAGNOSIS — Z6835 Body mass index (BMI) 35.0-35.9, adult: Secondary | ICD-10-CM

## 2022-12-15 LAB — POCT GLYCOSYLATED HEMOGLOBIN (HGB A1C): Hemoglobin A1C: 8.6 % — AB (ref 4.0–5.6)

## 2022-12-15 MED ORDER — EMPAGLIFLOZIN 10 MG PO TABS: 10.0000 mg | ORAL_TABLET | Freq: Every day | ORAL | 1 refills | Status: DC

## 2022-12-15 MED ORDER — TRULICITY 1.5 MG/0.5ML ~~LOC~~ SOAJ: 1.5000 mg | SUBCUTANEOUS | 2 refills | Status: DC

## 2022-12-15 NOTE — Progress Notes (Signed)
12/15/2022, 6:17 PM  Endocrinology follow-up note   Subjective:    Patient ID: Rachel Stewart, female    DOB: 08/05/1970.  Rachel Stewart is being seen in follow-up after she was seen in consultation for management of currently uncontrolled symptomatic diabetes requested by  Floydene Flock, NP.   Past Medical History:  Diagnosis Date   Bronchitis, chronic (HCC)    last tx. 8'17   Diabetes mellitus without complication (HCC)    GERD (gastroesophageal reflux disease)    Hypertension    Neuromuscular disorder (HCC)    "tic pain" left face near ear area. Carpal tunnel bilateral. Arthritis knee.   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/2016   Procedure: LAPAROSCOPIC ROUX-EN-Y GASTRIC BYPASS WITH HIATAL HERNIA REPAIR, UPPER ENDO;  Surgeon: Luretha Murphy, MD;  Location: [facility];  Service: General;  Laterality: N/A;   SPINE SURGERY     lumbar decompression    Social History   Socioeconomic History   Marital status: Married    Spouse name: Not on file   Number of children: Not on file   Years of education: Not on file   Highest education level: Not on file  Occupational History   Not on file  Tobacco Use   Smoking status: Never   Smokeless tobacco: Never  Vaping Use   Vaping Use: Never used  Substance and Sexual Activity   Alcohol use: No   Drug use: No   Sexual activity: Yes  Other Topics Concern   Not on file  Social History Narrative   Not on file   Social Determinants of Health   Financial Resource Strain: Not on file  Food Insecurity: Not on file  Transportation Needs: Not on file  Physical Activity: Not on file  Stress: Not on file  Social Connections: Not on file    Family History  Problem Relation Age of Onset   Aneurysm Mother    Diabetes Mother     Hypertension Sister     Outpatient Encounter Medications as of 12/15/2022  Medication Sig   Dulaglutide (TRULICITY) 1.5 MG/0.5ML SOPN Inject 1.5 mg into the skin once a week.   empagliflozin (JARDIANCE) 10 MG TABS tablet Take 1 tablet (10 mg total) by mouth daily before breakfast.   Accu-Chek Softclix Lancets lancets Use as instructed to test blood glucose two times daily. Once in the morning and once at bedtime.   Blood Glucose Monitoring Suppl (ACCU-CHEK GUIDE) w/Device KIT Use to test blood glucose two times daily. Once in the morning and once at bedtime.   carvedilol (COREG) 6.25 MG tablet Take 1 tablet by mouth 2 (two) times daily with a meal.   Cholecalciferol (VITAMIN D3) 125 MCG (5000 UT) CAPS Take 1 capsule (5,000 Units total) by mouth daily.   cyanocobalamin (,VITAMIN B-12,) 1000 MCG/ML injection Inject 1,000 mcg into the muscle every 30 (thirty) days.   FERRETTS 325 (106 Fe) MG TABS tablet Take 2 tablets by mouth daily.   glipiZIDE (GLUCOTROL XL) 5 MG 24 hr tablet TAKE 1 TABLET BY MOUTH EVERY DAY WITH BREAKFAST   glucose blood (ACCU-CHEK GUIDE) test strip Use as instructed   hydrOXYzine (ATARAX/VISTARIL) 10 MG tablet Take 10 mg by mouth 3 (three) times daily as  needed.   losartan (COZAAR) 50 MG tablet Take 50 mg by mouth daily.   Multiple Vitamin (MULTIVITAMIN WITH MINERALS) TABS tablet Take 1 tablet by mouth daily.   omeprazole (PRILOSEC) 40 MG capsule Take 40 mg by mouth every morning.   [DISCONTINUED] Dulaglutide (TRULICITY) 4.5 MG/0.5ML SOPN Inject 4.5 mg as directed once a week.   [DISCONTINUED] phentermine (ADIPEX-P) 37.5 MG tablet Take 37.5 mg by mouth every morning.   No facility-administered encounter medications on file as of 12/15/2022.    ALLERGIES: Allergies  Allergen Reactions   Lisinopril Cough    VACCINATION STATUS: Immunization History  Administered Date(s) Administered   Moderna Sars-Covid-2 Vaccination 09/14/2019, 10/12/2019    Diabetes She presents  for her follow-up diabetic visit. She has type 2 diabetes mellitus. Onset time: She was diagnosed at approximate age of 33 years. Her disease course has been worsening. There are no hypoglycemic associated symptoms. Pertinent negatives for hypoglycemia include no confusion, headaches, pallor or seizures. Associated symptoms include fatigue. Pertinent negatives for diabetes include no chest pain, no polydipsia, no polyphagia and no polyuria. There are no hypoglycemic complications. Symptoms are worsening. There are no diabetic complications. Risk factors for coronary artery disease include diabetes mellitus, hypertension, obesity and sedentary lifestyle. Current diabetic treatments: She is currently oSaxenda 3 mg daily, glipizide 5 mg p.o. daily. She is compliant with treatment none of the time. Her weight is fluctuating minimally (Patient with history of moderate obesity, weight up to 275 pounds before she underwent Roux-en-Y gastric bypass in 2006 which allowed her to lose more than 100 pounds, however, more recently she is gaining.). She is following a generally unhealthy diet. When asked about meal planning, she reported none. She has not had a previous visit with a dietitian. She participates in exercise intermittently. (She brought her meter showing only 2 blood glucose monitoring in the last 90 days.  Her point-of-care A1c is 8.6%.  ) An ACE inhibitor/angiotensin II receptor blocker is being taken. Eye exam is current.  Hypertension This is a chronic problem. The current episode started more than 1 year ago. The problem is controlled. Pertinent negatives include no chest pain, headaches, palpitations or shortness of breath. Risk factors for coronary artery disease include diabetes mellitus, sedentary lifestyle and obesity. Past treatments include angiotensin blockers.      Objective:       12/15/2022    4:04 PM 08/06/2022    3:51 PM 12/27/2021   10:55 AM  Vitals with BMI  Height 5\' 4"  5\' 4"  5\' 4"    Weight 203 lbs 206 lbs 13 oz 206 lbs 3 oz  BMI 34.83 35.48 35.38  Systolic 108 104 284  Diastolic 68 72 76  Pulse 90 96 96    BP 108/68   Pulse 90   Ht 5\' 4"  (1.626 m)   Wt 203 lb (92.1 kg)   BMI 34.84 kg/m   Wt Readings from Last 3 Encounters:  12/15/22 203 lb (92.1 kg)  08/06/22 206 lb 12.8 oz (93.8 kg)  12/27/21 206 lb 3.2 oz (93.5 kg)     CMP ( most recent) CMP     Component Value Date/Time   NA 136 07/16/2022 1436   K 4.5 07/16/2022 1436   CL 100 07/16/2022 1436   CO2 23 07/16/2022 1436   GLUCOSE 287 (H) 07/16/2022 1436   GLUCOSE 158 (H) 06/18/2018 1608   BUN 15 07/16/2022 1436   CREATININE 0.97 07/16/2022 1436   CREATININE 0.76 06/18/2018 1608   CALCIUM 9.4  07/16/2022 1436   PROT 7.4 07/16/2022 1436   ALBUMIN 3.5 (L) 07/16/2022 1436   AST 17 07/16/2022 1436   ALT 27 07/16/2022 1436   ALKPHOS 98 07/16/2022 1436   BILITOT 0.3 07/16/2022 1436   GFRNONAA >60 08/15/2019 0000   GFRNONAA 92 06/18/2018 1608   GFRAA >60 08/15/2019 0000   GFRAA 107 06/18/2018 1608     Diabetic Labs (most recent): Lab Results  Component Value Date   HGBA1C 8.6 (A) 12/15/2022   HGBA1C 8.6 (A) 08/06/2022   HGBA1C 7.2 (A) 12/27/2021   MICROALBUR 1.6 06/18/2018     Lipid Panel ( most recent) Lipid Panel     Component Value Date/Time   CHOL 167 07/16/2022 1436   TRIG 194 (H) 07/16/2022 1436   HDL 47 07/16/2022 1436   CHOLHDL 3.6 07/16/2022 1436   CHOLHDL 4 06/18/2018 1608   VLDL 25.4 06/18/2018 1608   LDLCALC 87 07/16/2022 1436   LABVLDL 33 07/16/2022 1436      Lab Results  Component Value Date   TSH 0.766 12/23/2021   TSH 0.957 02/26/2021   TSH 0.836 08/16/2020   TSH 0.93 08/15/2019   FREET4 1.24 12/23/2021   FREET4 1.23 02/26/2021   FREET4 1.30 08/16/2020      Assessment & Plan:   1. Type 2 diabetes mellitus without complication, without long-term current use of insulin (HCC)  - Rachel Stewart has currently uncontrolled symptomatic type 2 DM since   52 years of age.  She brought her meter showing only 2 blood glucose monitoring in the last 90 days.  Her point-of-care A1c is 8.6%.    - I had a long discussion with her about the progressive nature of diabetes and the pathology behind its complications. -her diabetes is complicated by obesity and she remains at a high risk for more acute and chronic complications which include CAD, CVA, CKD, retinopathy, and neuropathy. These are all discussed in detail with her.  - I have counseled her on diet  and weight management  by adopting a carbohydrate restricted/protein rich diet. Patient is encouraged to switch to  unprocessed or minimally processed     complex starch and increased protein intake (animal or plant source), fruits, and vegetables. -  she is advised to stick to a routine mealtimes to eat 3 meals  a day and avoid unnecessary snacks ( to snack only to correct hypoglycemia).   - she acknowledges that there is a room for improvement in her food and drink choices. - Suggestion is made for her to avoid simple carbohydrates  from her diet including Cakes, Sweet Desserts, Ice Cream, Soda (diet and regular), Sweet Tea, Candies, Chips, Cookies, Store Bought Juices, Alcohol , Artificial Sweeteners,  Coffee Creamer, and "Sugar-free" Products, Lemonade. This will help patient to have more stable blood glucose profile and potentially avoid unintended weight gain.  The following Lifestyle Medicine recommendations according to American College of Lifestyle Medicine  [facility]) were discussed and and offered to patient and she  agrees to start the journey:  A. Whole Foods, Plant-Based Nutrition comprising of fruits and vegetables, plant-based proteins, whole-grain carbohydrates was discussed in detail with the patient.   A list for source of those nutrients were also provided to the patient.  Patient will use only water or unsweetened tea for hydration. B.  The need to stay away from risky substances including  alcohol, smoking; obtaining 7 to 9 hours of restorative sleep, at least 150 minutes of moderate intensity exercise  weekly, the importance of healthy social connections,  and stress management techniques were discussed. C.  A full color page of  Calorie density of various food groups per pound showing examples of each food groups was provided to the patient.    - she has been scheduled with Norm Salt, RDN, CDE for diabetes education.  - I have approached her with the following individualized plan to manage  her diabetes and patient agrees:   -In light of her presentation with uncontrolled glycemic profile, she is approached for more treatment.  Generally, she displays major dislike for medications and underwent lab for medication optimization specially for initiation and titration of insulin treatment.    -Accordingly, I discussed and added Jardiance 10 mg p.o. daily at bedtime.  Side effects and precautions discussed with her.  She is advised to continue glipizide 5 mg XL p.o. daily at breakfast.  -She did have problems obtaining the prescribed dose of Trulicity.  I advised her to stay on Trulicity 1.5 mg subcutaneously weekly.   She is approached and she agrees to monitor blood glucose twice a day-daily before breakfast and at bedtime.    - she is encouraged to call clinic for blood glucose levels less than 70 or above 150 mg daily at on 3 days a week fasting.    - Specific targets for  A1c;  LDL, HDL,  and Triglycerides were discussed with the patient.  2) Blood Pressure /Hypertension:  Her blood pressure is controlled to target.  she is advised to continue her current medications including losartan 50 mg p.o. daily with breakfast .  3) Lipids/Hyperlipidemia:   Her previsit lipid panel showed controlled LDL at 87.  she is on statin prescribed by her PCP.    Lifestyle medicine described above will help control lipids.     4)  Weight/Diet:  Body mass index is 34.84 kg/m.  -    clearly complicating her diabetes care.   she is  a candidate for weight loss. I discussed with her the fact that loss of 5 - 10% of her  current body weight will have the most impact on her diabetes management.  Exercise, and detailed carbohydrates information provided  -  detailed on discharge instructions.  Patient has lost more than 100 pounds with her to her last gastric bypass, currently retaining.  5) Chronic Care/Health Maintenance:  -she  is on ARB medications and  is encouraged to initiate and continue to follow up with Ophthalmology, Dentist,  Podiatrist at least yearly or according to recommendations, and advised to   stay away from smoking. I have recommended yearly flu vaccine and pneumonia vaccine at least every 5 years; moderate intensity exercise for up to 150 minutes weekly; and  sleep for at least 7 hours a day. Her screening ABI was normal in March 2022.  Her study will be repeated in March 2027 or sooner if needed.   - she is  advised to maintain close follow up with Floydene Flock, NP for primary care needs, as well as her other providers for optimal and coordinated care.   I spent  25  minutes in the care of the patient today including review of labs from CMP, Lipids, Thyroid Function, Hematology (current and previous including abstractions from other facilities); face-to-face time discussing  her blood glucose readings/logs, discussing hypoglycemia and hyperglycemia episodes and symptoms, medications doses, her options of short and long term treatment based on the latest standards of care / guidelines;  discussion about  incorporating lifestyle medicine;  and documenting the encounter. Risk reduction counseling performed per USPSTF guidelines to reduce  obesity and cardiovascular risk factors.     Please refer to Patient Instructions for Blood Glucose Monitoring and Insulin/Medications Dosing Guide"  in media tab for additional information. Please  also refer to " Patient Self  Inventory" in the Media  tab for reviewed elements of pertinent patient history.  Rachel Stewart, expressed understanding, and voiced agreement with the above plans.  All questions were answered to her satisfaction. she is encouraged to contact clinic should she have any questions or concerns prior to her return visit.   Follow up plan: - Return in about 3 months (around 03/17/2023) for F/U with Pre-visit Labs, Meter/CGM/Logs, A1c here.  Marquis Lunch, MD [facility] [address] [place], [place] [address] Phone: [phone]  Fax: [phone]    12/15/2022, 6:17 PM  This note was partially dictated with voice recognition software. Similar sounding words can be transcribed inadequately or may not  be corrected upon review.

## 2022-12-15 NOTE — Patient Instructions (Signed)

## 2023-01-01 ENCOUNTER — Encounter (HOSPITAL_COMMUNITY): Payer: Self-pay | Admitting: *Deleted

## 2023-02-12 NOTE — Patient Instructions (Addendum)
SURGICAL WAITING ROOM VISITATION Patients having surgery or a procedure may have no more than 2 support people in the waiting area - these visitors may rotate in the visitor waiting room.   Due to an increase in RSV and influenza rates and associated hospitalizations, children ages 59 and under may not visit patients in [facility] hospitals. If the patient needs to stay at the hospital during part of their recovery, the visitor guidelines for inpatient rooms apply.  PRE-OP VISITATION  Pre-op nurse will coordinate an appropriate time for 1 support person to accompany the patient in pre-op.  This support person may not rotate.  This visitor will be contacted when the time is appropriate for the visitor to come back in the pre-op area.  Please refer to the [facility] website for the visitor guidelines for Inpatients (after your surgery is over and you are in a regular room).  You are not required to quarantine at this time prior to your surgery. However, you must do this: Hand Hygiene often Do NOT share personal items Notify your provider if you are in close contact with someone who has COVID or you develop fever 100.4 or greater, new onset of sneezing, cough, sore throat, shortness of breath or body aches.  If you test positive for Covid or have been in contact with anyone that has tested positive in the last 10 days please notify you surgeon.    Your procedure is scheduled on:  Tuesday  February 17, 2023    Report to [facility] Main Entrance: [place] entrance where the [employer] is available.   Report to admitting at:  11:15   AM  Call this number if you have any questions or problems the morning of surgery [phone]  Do not eat food after Midnight the night prior to your surgery/procedure.  After Midnight you may have the following liquids until   ???      AM / PM DAY OF SURGERY  Clear Liquid Diet Water Black Coffee (sugar ok, NO MILK/CREAM OR CREAMERS)  Tea  (sugar ok, NO MILK/CREAM OR CREAMERS) regular and decaf                             Plain Jell-O  with no fruit (NO RED)                                           Fruit ices (not with fruit pulp, NO RED)                                     Popsicles (NO RED)                                                                  Juice: NO CITRUS JUICES: only apple, WHITE grape, WHITE cranberry Sports drinks like Gatorade or Powerade (NO RED)                   The day of surgery:  Drink ONE (1) Pre-Surgery G2  at  ????      AM the morning of surgery. Drink in one sitting. Do not sip.  This drink was given to you during your hospital pre-op appointment visit. Nothing else to drink after completing the Pre-Surgery G2 : No candy, chewing gum or throat lozenges.    FOLLOW BOWEL PREP AND ANY ADDITIONAL PRE OP INSTRUCTIONS YOU RECEIVED FROM YOUR SURGEON'S OFFICE!!!   Oral Hygiene is also important to reduce your risk of infection.        Remember - BRUSH YOUR TEETH THE MORNING OF SURGERY WITH YOUR REGULAR TOOTHPASTE  Do NOT smoke after Midnight the night before surgery.  STOP TAKING all Vitamins, Herbs and supplements 1 week before your surgery.   Diabetic Medications:   Dulaglutide (Trulicity)-  stop taking injections x 7 days prior to your surgery.  Sunday  Last dose: 02-02-23 (patient already stopped)  Empagliflozine (Jardiance)-  stop taking x 72 hours prior to surgery.  Last dose will me taken on: Friday 02-13-2023.  Glimepiride:  Day BEFORE surgery:  take it morning  as usual                     DAY OF SURGERY:  Do Not take Glimepiride.   Take ONLY these medicines the morning of surgery with A SIP OF WATER: Omeprazole (Prilosec), carvedilol (Coreg), You may use your Albuterol inhaler and Breo Ellipta if needed.    If You have been diagnosed with Sleep Apnea - Bring CPAP mask and tubing day of surgery. We will provide you with a CPAP machine on the day of your surgery.                   You  may not have any metal on your body including hair pins, jewelry, and body piercing  Do not wear make-up, lotions, powders, perfumes or deodorant  Do not wear nail polish including gel and S&S, artificial / acrylic nails, or any other type of covering on natural nails including finger and toenails. If you have artificial nails, gel coating, etc., that needs to be removed by a nail salon, Please have this removed prior to surgery. Not doing so may mean that your surgery could be cancelled or delayed if the Surgeon or anesthesia staff feels like they are unable to monitor you safely.   Do not shave 48 hours prior to surgery to avoid nicks in your skin which may contribute to postoperative infections.   You may bring a small overnight bag with you on the day of surgery, only pack items that are not valuable. [facility] IS NOT RESPONSIBLE   FOR VALUABLES THAT ARE LOST OR STOLEN.   Patients discharged on the day of surgery will not be allowed to drive home.  Someone NEEDS to stay with you for the first 24 hours after anesthesia.  Do not bring your home medications to the hospital  EXCEPT PLEASE BRING YOUR ALBUTEROL INHALER. . The Pharmacy will dispense medications listed on your medication list to you during your admission in the Hospital.   Please read over the following fact sheets you were given: IF YOU HAVE QUESTIONS ABOUT YOUR PRE-OP INSTRUCTIONS, PLEASE CALL [phone]   [facility] - Preparing for Surgery Before surgery, you can play an important role.  Because skin is not sterile, your skin needs to be as free of germs as possible.  You can reduce the number of germs on your skin by washing with CHG (chlorahexidine gluconate)  soap before surgery.  CHG is an antiseptic cleaner which kills germs and bonds with the skin to continue killing germs even after washing. Please DO NOT use if you have an allergy to CHG or antibacterial soaps.  If your skin becomes reddened/irritated stop using the  CHG and inform your nurse when you arrive at Short Stay. Do not shave (including legs and underarms) for at least 48 hours prior to the first CHG shower.  You may shave your face/neck.  Please follow these instructions carefully:  1.  Shower with CHG Soap the night before surgery and the  morning of surgery.  2.  If you choose to wash your hair, wash your hair first as usual with your normal  shampoo.  3.  After you shampoo, rinse your hair and body thoroughly to remove the shampoo.                             4.  Use CHG as you would any other liquid soap.  You can apply chg directly to the skin and wash.  Gently with a scrungie or clean washcloth.  5.  Apply the CHG Soap to your body ONLY FROM THE NECK DOWN.   Do not use on face/ open                           Wound or open sores. Avoid contact with eyes, ears mouth and genitals (private parts).                       Wash face,  Genitals (private parts) with your normal soap.             6.  Wash thoroughly, paying special attention to the area where your  surgery  will be performed.  7.  Thoroughly rinse your body with warm water from the neck down.  8.  DO NOT shower/wash with your normal soap after using and rinsing off the CHG Soap.            9.  Pat yourself dry with a clean towel.            10.  Wear clean pajamas.            11.  Place clean sheets on your bed the night of your first shower and do not  sleep with pets.  ON THE DAY OF SURGERY : Do not apply any lotions/deodorants the morning of surgery.  Please wear clean clothes to the hospital/surgery center.     FAILURE TO FOLLOW THESE INSTRUCTIONS MAY RESULT IN THE CANCELLATION OF YOUR SURGERY  PATIENT SIGNATURE_________________________________  NURSE SIGNATURE__________________________________  ________________________________________________________________________

## 2023-02-12 NOTE — Progress Notes (Addendum)
COVID Vaccine received:  []  No [x]  Yes Date of any COVID positive Test in last 90 days: None  PCP - Floydene Flock, NP  Cardiologist - Dr. Daryel November,  in [place],  [phone],   clearance Endocrinology-  Purcell Nails, MD  Chest x-ray - 05-25-2016  2v  Epic EKG - (05-25-2016 Epic)  02-13-2023  repeated Stress Test -  ECHO -  Cardiac Cath -   PCR screen: []  Ordered & Completed           []   No Order but Needs PROFEND           [x]   N/A for this surgery  Surgery Plan:  [x]  Ambulatory   []  Outpatient in bed  []  Admit  Anesthesia:    [x]  General  []  Spinal  []   Choice []   MAC  Bowel Prep - [x]  No  []   Yes ______   no orders as of 02-12-2023  Pacemaker / ICD device [x]  No []  Yes   Spinal Cord Stimulator:[x]  No []  Yes       History of Sleep Apnea? []  No [x]  Yes   CPAP used?- []  No [x]  Yes  wears somtimes  Does the patient monitor blood sugar?  [x]  No []  Yes  []  N/A  Patient has: []  NO Hx DM   []  Pre-DM   []  DM1  [x]   DM2 Last A1c was:   8.6     on   12-15-2022           Repeat at PST   Does patient have a Freestyle [place] or Dexacom? []  No []  Yes   Fasting Blood Sugar Ranges-  Checks Blood Sugar _0_ times a day  GLP1 agonist / usual dose - Dulaglutide (Trulicity) GLP1 instructions: hasn't taken since February 02, 2023 and will hold until after surgery.  SGLT-2 inhibitors / usual dose - Jardiance  10mg  SGLT-2 instructions: Hold x 72 hours.   Diabetic medications/ instructions: Glimepiride , Ok to take the Day before surgery but Don't take the DOS.   Blood Thinner / Instructions: none Aspirin Instructions:  none  ERAS Protocol Ordered: []  No  [x]  Yes PRE-SURGERY []  ENSURE  [x]  G2   []  No Drink Ordered Patient is to be NPO after: 1030  Activity level: Patient is able to climb a flight of stairs without difficulty; [x]  No CP  but would have SOB.   Patient can perform ADLs without assistance.   Anesthesia review: uncontrolled DM2, HTN, GERD, s/p Roux-en-y bypass (2017),  OSA - CPAP,   chronic bronchitis  Patient denies shortness of breath, fever, cough and chest pain at PAT appointment.  Patient verbalized understanding and agreement to the Pre-Surgical Instructions that were given to them at this PAT appointment. Patient was also educated of the need to review these PAT instructions again prior to her surgery.I reviewed the appropriate phone numbers to call if they have any and questions or concerns.

## 2023-02-13 ENCOUNTER — Ambulatory Visit: Payer: Self-pay | Admitting: Surgery

## 2023-02-13 ENCOUNTER — Encounter (HOSPITAL_COMMUNITY)
Admission: RE | Admit: 2023-02-13 | Discharge: 2023-02-13 | Disposition: A | Discharge status: Home / Self Care | Payer: BC Managed Care – PPO | Source: Ambulatory Visit | Attending: Surgery | Admitting: Surgery

## 2023-02-13 ENCOUNTER — Encounter (HOSPITAL_COMMUNITY): Payer: Self-pay

## 2023-02-13 ENCOUNTER — Other Ambulatory Visit: Payer: Self-pay

## 2023-02-13 VITALS — BP 111/78 | HR 84 | Temp 98.6°F | Resp 20 | Ht 64.0 in | Wt 205.0 lb

## 2023-02-13 DIAGNOSIS — Z01810 Encounter for preprocedural cardiovascular examination: Secondary | ICD-10-CM | POA: Diagnosis not present

## 2023-02-13 DIAGNOSIS — Z01818 Encounter for other preprocedural examination: Secondary | ICD-10-CM | POA: Diagnosis present

## 2023-02-13 DIAGNOSIS — R9431 Abnormal electrocardiogram [ECG] [EKG]: Secondary | ICD-10-CM | POA: Diagnosis not present

## 2023-02-13 DIAGNOSIS — Z01812 Encounter for preprocedural laboratory examination: Secondary | ICD-10-CM | POA: Insufficient documentation

## 2023-02-13 DIAGNOSIS — E119 Type 2 diabetes mellitus without complications: Secondary | ICD-10-CM | POA: Insufficient documentation

## 2023-02-13 DIAGNOSIS — I10 Essential (primary) hypertension: Secondary | ICD-10-CM | POA: Insufficient documentation

## 2023-02-13 HISTORY — DX: Unspecified osteoarthritis, unspecified site: M19.90

## 2023-02-13 HISTORY — DX: Anxiety disorder, unspecified: F41.9

## 2023-02-13 HISTORY — DX: Other complications of anesthesia, initial encounter: T88.59XA

## 2023-02-13 HISTORY — DX: Personal history of urinary calculi: Z87.442

## 2023-02-13 HISTORY — DX: Anemia, unspecified: D64.9

## 2023-02-13 HISTORY — DX: Chronic kidney disease, unspecified: N18.9

## 2023-02-13 LAB — CBC
HCT: 41.3 % (ref 36.0–46.0)
Hemoglobin: 12.8 g/dL (ref 12.0–15.0)
MCH: 28.2 pg (ref 26.0–34.0)
MCHC: 31 g/dL (ref 30.0–36.0)
MCV: 91 fL (ref 80.0–100.0)
Platelets: 356 10*3/uL (ref 150–400)
RBC: 4.54 MIL/uL (ref 3.87–5.11)
RDW: 13 % (ref 11.5–15.5)
WBC: 8 10*3/uL (ref 4.0–10.5)
nRBC: 0 % (ref 0.0–0.2)

## 2023-02-13 LAB — BASIC METABOLIC PANEL
Anion gap: 8 (ref 5–15)
BUN: 22 mg/dL — ABNORMAL HIGH (ref 6–20)
CO2: 24 mmol/L (ref 22–32)
Calcium: 8.9 mg/dL (ref 8.9–10.3)
Chloride: 104 mmol/L (ref 98–111)
Creatinine, Ser: 0.77 mg/dL (ref 0.44–1.00)
GFR, Estimated: >60 mL/min (ref >60)
Glucose, Bld: 151 mg/dL — ABNORMAL HIGH (ref 70–99)
Potassium: 3.6 mmol/L (ref 3.5–5.1)
Sodium: 136 mmol/L (ref 135–145)

## 2023-02-13 LAB — GLUCOSE, CAPILLARY: Glucose-Capillary: 142 mg/dL — ABNORMAL HIGH (ref 70–99)

## 2023-02-14 LAB — HEMOGLOBIN A1C
Hgb A1c MFr Bld: 7.4 % — ABNORMAL HIGH (ref 4.8–5.6)
Mean Plasma Glucose: 165.68 mg/dL

## 2023-02-17 ENCOUNTER — Other Ambulatory Visit: Payer: Self-pay

## 2023-02-17 ENCOUNTER — Encounter (HOSPITAL_COMMUNITY)
Admission: RE | Disposition: A | Discharge status: Home / Self Care | Payer: Self-pay | Source: Ambulatory Visit | Attending: Surgery

## 2023-02-17 ENCOUNTER — Ambulatory Visit (HOSPITAL_COMMUNITY): Payer: BC Managed Care – PPO

## 2023-02-17 ENCOUNTER — Ambulatory Visit (HOSPITAL_COMMUNITY): Payer: BC Managed Care – PPO | Admitting: Anesthesiology

## 2023-02-17 ENCOUNTER — Encounter (HOSPITAL_COMMUNITY): Payer: Self-pay | Admitting: Surgery

## 2023-02-17 ENCOUNTER — Ambulatory Visit (HOSPITAL_COMMUNITY)
Admission: RE | Admit: 2023-02-17 | Discharge: 2023-02-17 | Disposition: A | Discharge status: Home / Self Care | Payer: BC Managed Care – PPO | Source: Ambulatory Visit | Attending: Surgery | Admitting: Surgery

## 2023-02-17 DIAGNOSIS — Z7984 Long term (current) use of oral hypoglycemic drugs: Secondary | ICD-10-CM | POA: Diagnosis not present

## 2023-02-17 DIAGNOSIS — M1990 Unspecified osteoarthritis, unspecified site: Secondary | ICD-10-CM | POA: Diagnosis not present

## 2023-02-17 DIAGNOSIS — Z7985 Long-term (current) use of injectable non-insulin antidiabetic drugs: Secondary | ICD-10-CM | POA: Diagnosis not present

## 2023-02-17 DIAGNOSIS — Z6835 Body mass index (BMI) 35.0-35.9, adult: Secondary | ICD-10-CM | POA: Insufficient documentation

## 2023-02-17 DIAGNOSIS — Z01818 Encounter for other preprocedural examination: Secondary | ICD-10-CM

## 2023-02-17 DIAGNOSIS — Z9884 Bariatric surgery status: Secondary | ICD-10-CM | POA: Insufficient documentation

## 2023-02-17 DIAGNOSIS — K8010 Calculus of gallbladder with chronic cholecystitis without obstruction: Secondary | ICD-10-CM | POA: Insufficient documentation

## 2023-02-17 DIAGNOSIS — D649 Anemia, unspecified: Secondary | ICD-10-CM | POA: Insufficient documentation

## 2023-02-17 DIAGNOSIS — G4730 Sleep apnea, unspecified: Secondary | ICD-10-CM | POA: Insufficient documentation

## 2023-02-17 DIAGNOSIS — E669 Obesity, unspecified: Secondary | ICD-10-CM | POA: Diagnosis not present

## 2023-02-17 DIAGNOSIS — E785 Hyperlipidemia, unspecified: Secondary | ICD-10-CM | POA: Insufficient documentation

## 2023-02-17 DIAGNOSIS — F419 Anxiety disorder, unspecified: Secondary | ICD-10-CM | POA: Insufficient documentation

## 2023-02-17 DIAGNOSIS — Z87442 Personal history of urinary calculi: Secondary | ICD-10-CM | POA: Diagnosis not present

## 2023-02-17 DIAGNOSIS — E119 Type 2 diabetes mellitus without complications: Secondary | ICD-10-CM | POA: Diagnosis not present

## 2023-02-17 DIAGNOSIS — I10 Essential (primary) hypertension: Secondary | ICD-10-CM | POA: Diagnosis not present

## 2023-02-17 DIAGNOSIS — K219 Gastro-esophageal reflux disease without esophagitis: Secondary | ICD-10-CM | POA: Insufficient documentation

## 2023-02-17 HISTORY — PX: CHOLECYSTECTOMY: SHX55

## 2023-02-17 LAB — GLUCOSE, CAPILLARY
Glucose-Capillary: 214 mg/dL — ABNORMAL HIGH (ref 70–99)
Glucose-Capillary: 161 mg/dL — ABNORMAL HIGH (ref 70–99)
Glucose-Capillary: 218 mg/dL — ABNORMAL HIGH (ref 70–99)

## 2023-02-17 SURGERY — LAPAROSCOPIC CHOLECYSTECTOMY WITH INTRAOPERATIVE CHOLANGIOGRAM
Anesthesia: General

## 2023-02-17 MED ORDER — 0.9 % SODIUM CHLORIDE (POUR BTL) OPTIME
TOPICAL | Status: DC | PRN
Start: 2023-02-17 — End: 2023-02-17
  Administered 2023-02-17: 1000 mL

## 2023-02-17 MED ORDER — ORAL CARE MOUTH RINSE: 15.0000 mL | Freq: Once | OROMUCOSAL | Status: AC

## 2023-02-17 MED ORDER — OXYCODONE-ACETAMINOPHEN 5-325 MG PO TABS
1.0000 | ORAL_TABLET | ORAL | 0 refills | Status: DC | PRN
Start: 2023-02-17 — End: 2023-07-27

## 2023-02-17 MED ORDER — ONDANSETRON HCL 4 MG/2ML IJ SOLN
4.0000 mg | Freq: Once | INTRAMUSCULAR | Status: AC | PRN
  Administered 2023-02-17: 4 mg via INTRAVENOUS

## 2023-02-17 MED ORDER — ENOXAPARIN SODIUM 40 MG/0.4ML IJ SOSY
40.0000 mg | PREFILLED_SYRINGE | Freq: Once | INTRAMUSCULAR | Status: AC
  Administered 2023-02-17: 40 mg via SUBCUTANEOUS
  Filled 2023-02-17: qty 0.4

## 2023-02-17 MED ORDER — KETOROLAC TROMETHAMINE 15 MG/ML IJ SOLN
15.0000 mg | INTRAMUSCULAR | Status: AC
  Administered 2023-02-17: 15 mg via INTRAVENOUS
  Filled 2023-02-17: qty 1

## 2023-02-17 MED ORDER — DROPERIDOL 2.5 MG/ML IJ SOLN
INTRAMUSCULAR | Status: AC
  Administered 2023-02-17: 0.625 mg via INTRAVENOUS
  Filled 2023-02-17: qty 2

## 2023-02-17 MED ORDER — PHENYLEPHRINE 80 MCG/ML (10ML) SYRINGE FOR IV PUSH (FOR BLOOD PRESSURE SUPPORT)
PREFILLED_SYRINGE | INTRAVENOUS | Status: AC
  Filled 2023-02-17: qty 10

## 2023-02-17 MED ORDER — OXYCODONE HCL 5 MG PO TABS: 5.0000 mg | ORAL_TABLET | Freq: Once | ORAL | Status: DC | PRN

## 2023-02-17 MED ORDER — SUGAMMADEX SODIUM 200 MG/2ML IV SOLN
INTRAVENOUS | Status: DC | PRN
  Administered 2023-02-17: 200 mg via INTRAVENOUS

## 2023-02-17 MED ORDER — LACTATED RINGERS IV SOLN: INTRAVENOUS | Status: DC

## 2023-02-17 MED ORDER — MIDAZOLAM HCL 2 MG/2ML IJ SOLN
INTRAMUSCULAR | Status: DC | PRN
  Administered 2023-02-17: 2 mg via INTRAVENOUS

## 2023-02-17 MED ORDER — DEXAMETHASONE SODIUM PHOSPHATE 10 MG/ML IJ SOLN
INTRAMUSCULAR | Status: AC
  Filled 2023-02-17: qty 1

## 2023-02-17 MED ORDER — FENTANYL CITRATE (PF) 250 MCG/5ML IJ SOLN
INTRAMUSCULAR | Status: DC | PRN
  Administered 2023-02-17: 200 ug via INTRAVENOUS

## 2023-02-17 MED ORDER — BUPIVACAINE-EPINEPHRINE 0.25% -1:200000 IJ SOLN
INTRAMUSCULAR | Status: AC
  Filled 2023-02-17: qty 1

## 2023-02-17 MED ORDER — ONDANSETRON HCL 4 MG/2ML IJ SOLN
INTRAMUSCULAR | Status: AC
  Filled 2023-02-17: qty 2

## 2023-02-17 MED ORDER — BUPIVACAINE LIPOSOME 1.3 % IJ SUSP: 20.0000 mL | Freq: Once | INTRAMUSCULAR | Status: DC

## 2023-02-17 MED ORDER — LACTATED RINGERS IR SOLN
Status: DC | PRN
  Administered 2023-02-17: 1000 mL

## 2023-02-17 MED ORDER — OXYCODONE HCL 5 MG/5ML PO SOLN: 5.0000 mg | Freq: Once | ORAL | Status: DC | PRN

## 2023-02-17 MED ORDER — PROPOFOL 10 MG/ML IV BOLUS
INTRAVENOUS | Status: DC | PRN
  Administered 2023-02-17: 160 mg via INTRAVENOUS

## 2023-02-17 MED ORDER — ROCURONIUM BROMIDE 10 MG/ML (PF) SYRINGE
PREFILLED_SYRINGE | INTRAVENOUS | Status: DC | PRN
  Administered 2023-02-17: 70 mg via INTRAVENOUS

## 2023-02-17 MED ORDER — LIDOCAINE 2% (20 MG/ML) 5 ML SYRINGE
INTRAMUSCULAR | Status: DC | PRN
  Administered 2023-02-17: 80 mg via INTRAVENOUS

## 2023-02-17 MED ORDER — FENTANYL CITRATE (PF) 250 MCG/5ML IJ SOLN
INTRAMUSCULAR | Status: AC
  Filled 2023-02-17: qty 5

## 2023-02-17 MED ORDER — HYDROMORPHONE HCL 1 MG/ML IJ SOLN
INTRAMUSCULAR | Status: AC
  Administered 2023-02-17: 0.5 mg via INTRAVENOUS
  Filled 2023-02-17: qty 2

## 2023-02-17 MED ORDER — CHLORHEXIDINE GLUCONATE 0.12 % MT SOLN
15.0000 mL | Freq: Once | OROMUCOSAL | Status: AC
  Administered 2023-02-17: 15 mL via OROMUCOSAL

## 2023-02-17 MED ORDER — INSULIN ASPART 100 UNIT/ML IJ SOLN
INTRAMUSCULAR | Status: DC | PRN
Start: 2023-02-17 — End: 2023-02-17
  Administered 2023-02-17: 2 [IU] via SUBCUTANEOUS

## 2023-02-17 MED ORDER — DROPERIDOL 2.5 MG/ML IJ SOLN: 0.6250 mg | Freq: Once | INTRAMUSCULAR | Status: AC | PRN

## 2023-02-17 MED ORDER — ROCURONIUM BROMIDE 10 MG/ML (PF) SYRINGE
PREFILLED_SYRINGE | INTRAVENOUS | Status: AC
  Filled 2023-02-17: qty 10

## 2023-02-17 MED ORDER — INSULIN ASPART 100 UNIT/ML IJ SOLN
0.0000 [IU] | INTRAMUSCULAR | Status: DC | PRN
  Administered 2023-02-17: 4 [IU] via SUBCUTANEOUS
  Filled 2023-02-17: qty 1

## 2023-02-17 MED ORDER — HYDROMORPHONE HCL 1 MG/ML IJ SOLN
0.2500 mg | INTRAMUSCULAR | Status: DC | PRN
  Administered 2023-02-17: 0.5 mg via INTRAVENOUS

## 2023-02-17 MED ORDER — LABETALOL HCL 5 MG/ML IV SOLN
INTRAVENOUS | Status: DC | PRN
Start: 2023-02-17 — End: 2023-02-17
  Administered 2023-02-17: 5 mg via INTRAVENOUS
  Administered 2023-02-17 (×2): 2.5 mg via INTRAVENOUS

## 2023-02-17 MED ORDER — ONDANSETRON HCL 4 MG/2ML IJ SOLN
INTRAMUSCULAR | Status: DC | PRN
  Administered 2023-02-17: 4 mg via INTRAVENOUS

## 2023-02-17 MED ORDER — DEXAMETHASONE SODIUM PHOSPHATE 10 MG/ML IJ SOLN
INTRAMUSCULAR | Status: DC | PRN
  Administered 2023-02-17: 10 mg via INTRAVENOUS

## 2023-02-17 MED ORDER — BUPIVACAINE-EPINEPHRINE 0.25% -1:200000 IJ SOLN
INTRAMUSCULAR | Status: DC | PRN
  Administered 2023-02-17: 30 mL

## 2023-02-17 MED ORDER — CHLORHEXIDINE GLUCONATE CLOTH 2 % EX PADS: 6.0000 | MEDICATED_PAD | Freq: Once | CUTANEOUS | Status: DC

## 2023-02-17 MED ORDER — LIDOCAINE HCL (PF) 2 % IJ SOLN
INTRAMUSCULAR | Status: AC
  Filled 2023-02-17: qty 5

## 2023-02-17 MED ORDER — GABAPENTIN 300 MG PO CAPS
300.0000 mg | ORAL_CAPSULE | ORAL | Status: AC
  Administered 2023-02-17: 300 mg via ORAL
  Filled 2023-02-17: qty 1

## 2023-02-17 MED ORDER — PROPOFOL 10 MG/ML IV BOLUS
INTRAVENOUS | Status: AC
  Filled 2023-02-17: qty 20

## 2023-02-17 MED ORDER — IOHEXOL 300 MG/ML  SOLN
INTRAMUSCULAR | Status: DC | PRN
  Administered 2023-02-17: 10 mL

## 2023-02-17 MED ORDER — PHENYLEPHRINE 80 MCG/ML (10ML) SYRINGE FOR IV PUSH (FOR BLOOD PRESSURE SUPPORT)
PREFILLED_SYRINGE | INTRAVENOUS | Status: DC | PRN
  Administered 2023-02-17 (×3): 160 ug via INTRAVENOUS

## 2023-02-17 MED ORDER — CEFAZOLIN SODIUM-DEXTROSE 2-4 GM/100ML-% IV SOLN
2.0000 g | INTRAVENOUS | Status: AC
  Administered 2023-02-17: 2 g via INTRAVENOUS
  Filled 2023-02-17: qty 100

## 2023-02-17 MED ORDER — ACETAMINOPHEN 500 MG PO TABS
1000.0000 mg | ORAL_TABLET | ORAL | Status: AC
  Administered 2023-02-17: 1000 mg via ORAL
  Filled 2023-02-17: qty 2

## 2023-02-17 MED ORDER — MIDAZOLAM HCL 2 MG/2ML IJ SOLN
INTRAMUSCULAR | Status: AC
  Filled 2023-02-17: qty 2

## 2023-02-17 SURGICAL SUPPLY — 45 items
ADH SKN CLS APL DERMABOND .7 (GAUZE/BANDAGES/DRESSINGS) ×1
APL PRP STRL LF DISP 70% ISPRP (MISCELLANEOUS) ×1
APPLIER CLIP ROT 10 11.4 M/L (STAPLE) ×1
APR CLP MED LRG 11.4X10 (STAPLE) ×1
BAG COUNTER SPONGE SURGICOUNT (BAG) IMPLANT
BAG SPEC RTRVL 10 TROC 200 (ENDOMECHANICALS) ×1
BAG SPNG CNTER NS LX DISP (BAG) ×1
CABLE HIGH FREQUENCY MONO STRZ (ELECTRODE) ×1 IMPLANT
CATH URETL OPEN 5X70 (CATHETERS) IMPLANT
CHLORAPREP W/TINT 26 (MISCELLANEOUS) ×1 IMPLANT
CLIP APPLIE ROT 10 11.4 M/L (STAPLE) ×1 IMPLANT
COVER MAYO STAND XLG (MISCELLANEOUS) ×1 IMPLANT
COVER SURGICAL LIGHT HANDLE (MISCELLANEOUS) ×1 IMPLANT
DERMABOND ADVANCED .7 DNX12 (GAUZE/BANDAGES/DRESSINGS) ×1 IMPLANT
DRAPE C-ARM 42X120 X-RAY (DRAPES) IMPLANT
ELECT REM PT RETURN 15FT ADLT (MISCELLANEOUS) ×1 IMPLANT
ENDOLOOP SUT PDS II 0 18 (SUTURE) ×1 IMPLANT
GLOVE BIO SURGEON STRL SZ7.5 (GLOVE) ×1 IMPLANT
GLOVE INDICATOR 8.0 STRL GRN (GLOVE) ×1 IMPLANT
GOWN STRL REUS W/ TWL XL LVL3 (GOWN DISPOSABLE) ×2 IMPLANT
GOWN STRL REUS W/TWL XL LVL3 (GOWN DISPOSABLE) ×2
GRASPER SUT TROCAR 14GX15 (MISCELLANEOUS) IMPLANT
HEMOSTAT SNOW SURGICEL 2X4 (HEMOSTASIS) IMPLANT
IRRIG SUCT STRYKERFLOW 2 WTIP (MISCELLANEOUS) ×1
IRRIGATION SUCT STRKRFLW 2 WTP (MISCELLANEOUS) ×1 IMPLANT
IV CATH 14GX2 1/4 (CATHETERS) ×1 IMPLANT
KIT BASIN OR (CUSTOM PROCEDURE TRAY) ×1 IMPLANT
KIT TURNOVER KIT A (KITS) IMPLANT
NDL INSUFFLATION 14GA 120MM (NEEDLE) ×1 IMPLANT
NEEDLE INSUFFLATION 14GA 120MM (NEEDLE) ×1
PENCIL SMOKE EVACUATOR (MISCELLANEOUS) IMPLANT
POUCH RETRIEVAL ECOSAC 10 (ENDOMECHANICALS) ×1 IMPLANT
SCISSORS LAP 5X35 DISP (ENDOMECHANICALS) ×1 IMPLANT
SET TUBE SMOKE EVAC HIGH FLOW (TUBING) ×1 IMPLANT
SLEEVE ADV FIXATION 5X100MM (TROCAR) IMPLANT
SLEEVE Z-THREAD 5X100MM (TROCAR) ×2 IMPLANT
SPIKE FLUID TRANSFER (MISCELLANEOUS) ×1 IMPLANT
STOPCOCK 4 WAY LG BORE MALE ST (IV SETS) IMPLANT
SUT MNCRL AB 4-0 PS2 18 (SUTURE) ×1 IMPLANT
SUT SILK 2 0 SH (SUTURE) IMPLANT
TOWEL OR 17X26 10 PK STRL BLUE (TOWEL DISPOSABLE) ×1 IMPLANT
TOWEL OR NON WOVEN STRL DISP B (DISPOSABLE) IMPLANT
TRAY LAPAROSCOPIC (CUSTOM PROCEDURE TRAY) ×1 IMPLANT
TROCAR ADV FIXATION 12X100MM (TROCAR) ×1 IMPLANT
TROCAR Z-THREAD OPTICAL 5X100M (TROCAR) ×1 IMPLANT

## 2023-02-17 NOTE — H&P (Signed)
Admitting Physician: Hyman Hopes Reagyn Facemire  Service: General Surgery  CC: Abdominal Pain  Subjective   HPI: Rachel Stewart is an 52 y.o. female who is here for gallbladder surgery  Past Medical History:  Diagnosis Date   Anemia    Anxiety    Arthritis    Bronchitis, chronic (HCC)    last tx. 8'17   Chronic kidney disease    Complication of anesthesia    hard to wake up after anesthesia   Diabetes mellitus without complication (HCC)    GERD (gastroesophageal reflux disease)    History of kidney stones    Hypertension    Neuromuscular disorder (HCC)    "tic pain" left face near ear area. Carpal tunnel bilateral. Arthritis knee.   Sleep apnea    Ventral hernia    at present    Past Surgical History:  Procedure Laterality Date   BREAST SURGERY     breast reduction   CYST EXCISION     left side of neck,  Benign cyst   LAPAROSCOPIC ROUX-EN-Y GASTRIC BYPASS WITH HIATAL HERNIA REPAIR N/A 05/19/2016   Procedure: LAPAROSCOPIC ROUX-EN-Y GASTRIC BYPASS WITH HIATAL HERNIA REPAIR, UPPER ENDO;  Surgeon: Luretha Murphy, MD;  Location: [facility];  Service: General;  Laterality: N/A;   SPINE SURGERY     lumbar decompression    Family History  Problem Relation Age of Onset   Aneurysm Mother    Diabetes Mother    Hypertension Sister     Social:  reports that she has never smoked. She has never used smokeless tobacco. She reports that she does not drink alcohol and does not use drugs.  Allergies:  Allergies  Allergen Reactions   Lisinopril Cough    Medications: Current Outpatient Medications  Medication Instructions   Accu-Chek Softclix Lancets lancets Use as instructed to test blood glucose two times daily. Once in the morning and once at bedtime.   albuterol (VENTOLIN HFA) 108 (90 Base) MCG/ACT inhaler 2 puffs, Inhalation, Every 6 hours PRN   atorvastatin (LIPITOR) 40 MG tablet 1 tablet, Oral, Daily   Blood Glucose Monitoring Suppl (ACCU-CHEK GUIDE) w/Device KIT Use  to test blood glucose two times daily. Once in the morning and once at bedtime.   BREO ELLIPTA 200-25 MCG/ACT AEPB 1 puff, Inhalation, Daily   carvedilol (COREG) 6.25 mg, Oral, 2 times daily with meals   cyanocobalamin (VITAMIN B12) 1,000 mcg, Intramuscular, Every 30 days   empagliflozin (JARDIANCE) 10 mg, Oral, Daily before breakfast   ferrous sulfate 650 mg, Oral, Daily with breakfast   glimepiride (AMARYL) 4 mg, Oral, Daily   glucose blood (ACCU-CHEK GUIDE) test strip Use as instructed   losartan-hydrochlorothiazide (HYZAAR) 50-12.5 MG tablet 1 tablet, Oral, Daily   meloxicam (MOBIC) 15 mg, Oral, Daily PRN   Multiple Vitamin (MULTIVITAMIN WITH MINERALS) TABS tablet 1 tablet, Oral, Daily   omeprazole (PRILOSEC) 40 mg, Oral, Every morning   tiZANidine (ZANAFLEX) 2 MG tablet 1 tablet, Oral, At bedtime PRN   Trulicity 1.5 mg, Subcutaneous, Weekly   Vitamin D3 5,000 Units, Oral, Daily    ROS - all of the below systems have been reviewed with the patient and positives are indicated with bold text General: chills, fever or night sweats Eyes: blurry vision or double vision ENT: epistaxis or sore throat Allergy/Immunology: itchy/watery eyes or nasal congestion Hematologic/Lymphatic: bleeding problems, blood clots or swollen lymph nodes Endocrine: temperature intolerance or unexpected weight changes Breast: new or changing breast lumps or nipple discharge Resp: cough,  shortness of breath, or wheezing CV: chest pain or dyspnea on exertion GI: as per HPI GU: dysuria, trouble voiding, or hematuria MSK: joint pain or joint stiffness Neuro: TIA or stroke symptoms Derm: pruritus and skin lesion changes Psych: anxiety and depression  Objective   PE Last menstrual period 02/02/2023. Constitutional: NAD; conversant; no deformities Eyes: Moist conjunctiva; no lid lag; anicteric; PERRL Neck: Trachea midline; no thyromegaly Lungs: Normal respiratory effort; no tactile fremitus CV: RRR; no  palpable thrills; no pitting edema GI: Abd soft, nontender; no palpable hepatosplenomegaly MSK: Normal range of motion of extremities; no clubbing/cyanosis Psychiatric: Appropriate affect; alert and oriented x3 Lymphatic: No palpable cervical or axillary lymphadenopathy  No results found for this or any previous visit (from the past 24 hour(s)).  Imaging Orders  No imaging studies ordered today  CT Chest W/O Contrast 12/25/2022 - completed at [facility]  No acute pulmonary findings Calcified gallstones noted.    Assessment and Plan   [name] has gallstones and symptoms consistent with biliary colic. I recommended laparoscopic cholecystectomy with intraoperative cholangiogram. We discussed the procedure itself as well as its risk, benefits, and alternatives. After full discussion all questions answered the patient granted consent to proceed. Our surgery scheduler will reach out to the patient to schedule surgery.    Quentin Ore, MD  [facility] Use [email or web address] to contact on call provider

## 2023-02-17 NOTE — Op Note (Addendum)
Patient: Rachel Stewart (Feb 17, 1971, [id number])  Date of Surgery: 02/17/23  Preoperative Diagnosis: Cholecystitis   Postoperative Diagnosis: Cholecystitis   Surgical Procedure: LAPAROSCOPIC CHOLECYSTECTOMY WITH INTRAOPERATIVE CHOLANGIOGRAM Closure of retro-roux mesenteric defect with running 2-0 silk suture  Operative Team Members:  Surgeons and Role:    * Jenner Rosier, Hyman Hopes, MD - Primary   Anesthesiologist: Mal Amabile, MD; Nance Pew Nelle Don, DO CRNA: [place], Stephanie C, CRNA; Florene Route, CRNA   Anesthesia: General   Fluids:  Total I/O In: 1000 [I.V.:1000] Out: 25 [Blood:25]  Complications: none  Drains:  none   Specimen:  ID Type Source Tests Collected by Time Destination  1 : gallbladder Tissue PATH Gallbladder SURGICAL PATHOLOGY Arriel Victor, Hyman Hopes, MD 02/17/2023 1402      Disposition:  PACU - hemodynamically stable.  Plan of Care: Discharge to home after PACU    Indications for Procedure: Rachel Stewart is a 52 y.o. female who presented with abdominal pain.  History, physical and imaging was concerning for cholecystitis.  Laparoscopic cholecystectomy was recommended for the patient.  The procedure itself, as well as the risks, benefits and alternatives were discussed with the patient.  Risks discussed included but were not limited to the risk of infection, bleeding, damage to nearby structures, need to convert to open procedure, incisional hernia, bile leak, common bile duct injury and the need for additional procedures or surgeries.  With this discussion complete and all questions answered the patient granted consent to proceed.  Findings: Gallstones. Normal cholangiogram. Open retro-roux mesenteric defect, closed with 2-0 silk suture in running fashion.  No JJ mesenteric defect - appeared closed.  Long candy-cane limb.  Infection status: Patient: Private Patient Elective Case Case: Elective Infection Present At Time Of Surgery (PATOS):   Some spillage of bile related to performing a cholangiogram.   Description of Procedure:   On the date stated above, the patient was taken to the operating room suite and placed in supine positioning.  Sequential compression devices were placed on the lower extremities to prevent blood clots.  General endotracheal anesthesia was induced. Preoperative antibiotics were given.  The patient's abdomen was prepped and draped in the usual sterile fashion.  A time-out was completed verifying the correct patient, procedure, positioning and equipment needed for the case.  We began by anesthetizing the skin with local anesthetic and then making a 5 mm incision just below the umbilicus.  We dissected through the subcutaneous tissues to the fascia.  The fascia was grasped and elevated using a Kocher clamp.  A Veress needle was inserted into the abdomen and the abdomen was insufflated to 15 mmHg.  A 5 mm trocar was inserted in this position under optical guidance and then the abdomen was inspected.  There was no trauma to the underlying viscera with initial trocar placement.  Any abnormal findings, other than inflammation in the right upper quadrant, are listed above in the findings section.  Three additional trocars were placed, one 12 mm trocar in the subxiphoid position, one 5 mm trocar in the midline epigastric area and one 5mm trocar in the right upper quadrant subcostally.  These were placed under direct vision without any trauma to the underlying viscera.    The patient was then placed in head up, left side down positioning.  The gallbladder was identified and dissected free from its attachments to the omentum allowing the duodenum to fall away.  The infundibulum of the gallbladder was dissected free working laterally to medially.  The cystic  duct and cystic artery were dissected free from surrounding connective tissue.  The infundibulum of the gallbladder was dissected off the cystic plate.  A critical view of  safety was obtained with the cystic duct and cystic artery being cleared of connective tissues and clearly the only two structures entering into the gallbladder with the liver clearly visible behind.  One clip was applied high on the cystic duct.  A small ductotomy was created below this using the endoscopic shears.  A cholangiogram catheter was introduced through the abdominal wall and into the cystic duct through this ductotomy.  The catheter was clipped into position.  The catheter was flushed to ensure no leakage around the clip.  We then removed the laparoscopic instruments and positioned the C-Arm to perform a cholangiogram.  The catheter was flushed with contrast under fluoroscopic visualization and a cholangiogram was obtained.  The cholangiogram visualized the biliary tree from the ampulla up to the first two biliary radicals in the liver.  There were no filling defects identified.  The catheter clearly entered the cystic duct.  There was gradual tapering of the common bile duct down to the ampulla without evidence of stricture or other abnormalities.  Please see the EMR for saved representative images.  With our cholangiogram compete, we moved the c-arm away from the field and returned to laparoscopic surgery.    Clips were then applied to the cystic duct and cystic artery and then these structures were divided.  A PDS endoloop was placed to secure the cystic duct.  The gallbladder was dissected off the cystic plate, placed in an endocatch bag and removed from the 12 mm subxiphoid port site.  The clips were inspected and appeared effective.  The cystic plate was inspected and hemostasis was obtained using electrocautery.  A suction irrigator was used to clean the operative field.    I then leveled the patient and examined her gastric bypass anatomy.  The Roux limb was run to the jejunojejunostomy.  The limb length seemed appropriate.  There was no jejunojejunostomy mesenteric defect.  The common  channel was inspected for short distance.  The hepatobiliary limb was inspected for short distance.  The retro-Roux mesenteric defect was inspected.  It appeared to be open with only a small band of scar tissue between the Roux limb mesentery and the mesocolon.  Two 2-0 silk sutures were used to run this defect closed using a laparoscopic needle driver.  Attention was turned to closure.  The 12 mm subxiphoid port site was closed using a 0-vicryl suture on a fascial suture passer.  The abdomen was desufflated.  The skin was closed using 4-0 monocryl and dermabond.  All sponge and needle counts were correct at the conclusion of the case.    Ivar Drape, MD General, Bariatric, & Minimally Invasive Surgery [facility], [place]

## 2023-02-17 NOTE — Discharge Instructions (Addendum)
CHOLECYSTECTOMY POST OPERATIVE INSTRUCTIONS  Thinking Clearly  The anesthesia may cause you to feel different for 1 or 2 days. Do not drive, drink alcohol, or make any big decisions for at least 2 days.  Nutrition When you wake up, you will be able to drink small amounts of liquid. If you do not feel sick, you can slowly advance your diet to regular foods. Continue to drink lots of fluids, usually about 8 to 10 glasses per day. Eat a high-fiber diet so you don't strain during bowel movements. High-Fiber Foods Foods high in fiber include beans, bran cereals and whole-grain breads, peas, dried fruit (figs, apricots, and dates), raspberries, blackberries, strawberries, sweet corn, broccoli, baked potatoes with skin, plums, pears, apples, greens, and nuts. Activity Slowly increase your activity. Be sure to get up and walk every hour or so to prevent blood clots. No heavy lifting or strenuous activity for 4 weeks following surgery to prevent hernias at your incision sites It is normal to feel tired. You may need more sleep than usual.  Get your rest but make sure to get up and move around frequently to prevent blood clots and pneumonia.  Work and Return to [employer] can go back to work when you feel well enough. Discuss the timing with your surgeon. You can usually go back to school or work 1 week after an operation. If your work requires heavy lifting or strenuous activity you need to be placed on light duty for 4 weeks following surgery. You can return to gym class, sports or other physical activities 4 weeks after surgery.  Wound Care Always wash your hands before and after touching near your incision site. Do not soak in a bathtub until cleared at your follow up appointment. You may take a shower 24 hours after surgery. A small amount of drainage from the incision is normal. If the drainage is thick and yellow or the site is red, you may have an infection, so call your surgeon. If you  have a drain in one of your incisions, it will be taken out in office when the drainage stops. Steri-Strips will fall off in 7 to 10 days or they will be removed during your first office visit. If you have dermabond glue covering over the incision, allow the glue to flake off on its own. Avoid wearing tight or rough clothing. It may rub your incisions and make it harder for them to heal. Protect the new skin, especially from the sun. The sun can burn and cause darker scarring. Your scar will heal in about 4 to 6 weeks and will become softer and continue to fade over the next year.  The cosmetic appearance of the incisions will improve over the course of the first year after surgery. Sensation around your incision will return in a few weeks or months.  Bowel Movements After intestinal surgery, you may have loose watery stools for several days. If watery diarrhea lasts longer than 3 days, contact your surgeon. Pain medication (narcotics) can cause constipation. Increase the fiber in your diet with high-fiber foods if you are constipated. You can take an over the counter stool softener like Colace to avoid constipation.  Additional over the counter medications can also be used if Colace isn't sufficient (for example, Milk of Magnesia or Miralax).  Pain The amount of pain is different for each person. Some people need only 1 to 3 doses of pain control medication, while others need more. Take alternating doses of tylenol  and ibuprofen around the clock for the first five days following surgery.  This will provide a baseline of pain control and help with inflammation.  Take the narcotic pain medication in addition if needed for severe pain.  Contact Your Surgeon at [phone], if you have: Pain in your right upper abdomen like a gallbladder attack. Pain that will not go away Pain that gets worse A fever of more than 101F (38.3C) Repeated vomiting Swelling, redness, bleeding, or bad-smelling  drainage from your wound site Strong abdominal pain No bowel movement or unable to pass gas for 3 days Watery diarrhea lasting longer than 3 days  Pain Control The goal of pain control is to minimize pain, keep you moving and help you heal. Your surgical team will work with you on your pain plan. Most often a combination of therapies and medications are used to control your pain. You may also be given medication (local anesthetic) at the surgical site. This may help control your pain for several days. Extreme pain puts extra stress on your body at a time when your body needs to focus on healing. Do not wait until your pain has reached a level "10" or is unbearable before telling your doctor or nurse. It is much easier to control pain before it becomes severe. Following a laparoscopic procedure, pain is sometimes felt in the shoulder. This is due to the gas inserted into your abdomen during the procedure. Moving and walking helps to decrease the gas and the right shoulder pain.  Use the guide below for ways to manage your post-operative pain. Learn more by going to facs.org/safepaincontrol.  How Intense Is My Pain Common Therapies to Feel Better       I hardly notice my pain, and it does not interfere with my activities.  I notice my pain and it distracts me, but I can still do activities (sitting up, walking, standing).  Non-Medication Therapies  Ice (in a bag, applied over clothing at the surgical site), elevation, rest, meditation, massage, distraction (music, TV, play) walking and mild exercise Splinting the abdomen with pillows +  Non-Opioid Medications Acetaminophen (Tylenol)  AVOID NSAIDS DUE TO PREVIOUS GASTRIC BYPASS     My pain is hard to ignore and is more noticeable even when I rest.  My pain interferes with my usual activities.  Non-Medication Therapies  +  Non-Opioid medications  Take on a regular schedule (around-the-clock) instead of as needed. (For example,  Tylenol every 6 hours at 9:00 am, 3:00 pm, 9:00 pm, 3:00 am and Motrin every 6 hours at 12:00 am, 6:00 am, 12:00 pm, 6:00 pm)         I am focused on my pain, and I am not doing my daily activities.  I am groaning in pain, and I cannot sleep. I am unable to do anything.  My pain is as bad as it could be, and nothing else matters.  Non-Medication Therapies  +  Around-the-Clock Non-Opioid Medications  +  Short-acting opioids  Opioids should be used with other medications to manage severe pain. Opioids block pain and give a feeling of euphoria (feel high). Addiction, a serious side effect of opioids, is rare with short-term (a few days) use.  Examples of short-acting opioids include: Tramadol (Ultram), Hydrocodone (Norco, Vicodin), Hydromorphone (Dilaudid), Oxycodone (Oxycontin)     The above directions have been adapted from the [employer] Surgical Patient Education Program.  Please refer to the ACS website if needed: FreakyMates.[email or web address].ashx.   Renae Fickle  Ste[place]schul[place], MD [facility] [address], [place], [place]  [address] ?  [address] [address], [place], [place]   [address] [phone] ? [phone] ? FAX [phone] Web site: [email or web address]

## 2023-02-17 NOTE — Transfer of Care (Signed)
Immediate Anesthesia Transfer of Care Note  Patient: Rachel Stewart  Procedure(s) Performed: LAPAROSCOPIC CHOLECYSTECTOMY WITH INTRAOPERATIVE CHOLANGIOGRAM Closure of retro-roux mesenteric defect  Patient Location: PACU  Anesthesia Type:General  Level of Consciousness: drowsy  Airway & Oxygen Therapy: Patient Spontanous Breathing and Patient connected to face mask oxygen  Post-op Assessment: Report given to RN and Post -op Vital signs reviewed and stable  Post vital signs: Reviewed and stable  Last Vitals:  Vitals Value Taken Time  BP 179/121 02/17/23 1455  Temp    Pulse 77 02/17/23 1458  Resp 24 02/17/23 1458  SpO2 100 % 02/17/23 1458  Vitals shown include unfiled device data.  Last Pain: There were no vitals filed for this visit.       Complications: No notable events documented.

## 2023-02-17 NOTE — Anesthesia Preprocedure Evaluation (Addendum)
Anesthesia Evaluation  Patient identified by MRN, date of birth, ID band Patient awake    Reviewed: Allergy & Precautions, NPO status , Patient's Chart, lab work & pertinent test results, reviewed documented beta blocker date and time   History of Anesthesia Complications (+) history of anesthetic complications  Airway Mallampati: III  TM Distance: >3 FB Neck ROM: Full    Dental no notable dental hx. (+) Dental Advisory Given, Teeth Intact   Pulmonary sleep apnea and Continuous Positive Airway Pressure Ventilation    breath sounds clear to auscultation       Cardiovascular hypertension, Pt. on medications and Pt. on home beta blockers  Rhythm:Regular Rate:Normal     Neuro/Psych   Anxiety      Neuromuscular disease    GI/Hepatic Neg liver ROS,GERD  Medicated,,Cholelithiasis with cholecystitis  S/P Roux en Y gastric bypass   Endo/Other  diabetes, Poorly Controlled, Type 2, Oral Hypoglycemic Agents  Hyperlipidemia Obesity GLP-1 RA therapy  Renal/GU Renal diseaseHx/o renal calculi  negative genitourinary   Musculoskeletal  (+) Arthritis , Osteoarthritis,    Abdominal  (+) + obese  Peds  Hematology  (+) Blood dyscrasia, anemia   Anesthesia Other Findings   Reproductive/Obstetrics                             Lab Results  Component Value Date   WBC 8.0 02/13/2023   HGB 12.8 02/13/2023   HCT 41.3 02/13/2023   MCV 91.0 02/13/2023   PLT 356 02/13/2023   Lab Results  Component Value Date   CREATININE 0.77 02/13/2023   BUN 22 (H) 02/13/2023   NA 136 02/13/2023   K 3.6 02/13/2023   CL 104 02/13/2023   CO2 24 02/13/2023    Anesthesia Physical Anesthesia Plan  ASA: 2  Anesthesia Plan: General   Post-op Pain Management:    Induction: Intravenous and Cricoid pressure planned  PONV Risk Score and Plan: 4 or greater and Treatment may vary due to age or medical condition, Midazolam,  Ondansetron and Dexamethasone  Airway Management Planned: Oral ETT  Additional Equipment: None  Intra-op Plan:   Post-operative Plan: Extubation in OR  Informed Consent: I have reviewed the patients History and Physical, chart, labs and discussed the procedure including the risks, benefits and alternatives for the proposed anesthesia with the patient or authorized representative who has indicated his/her understanding and acceptance.     Dental advisory given  Plan Discussed with: CRNA and Anesthesiologist  Anesthesia Plan Comments:         Anesthesia Quick Evaluation

## 2023-02-17 NOTE — Anesthesia Procedure Notes (Signed)
Procedure Name: Intubation Date/Time: 02/17/2023 1:16 PM  Performed by: Florene Route, CRNAPre-anesthesia Checklist: Patient identified, Emergency Drugs available, Suction available and Patient being monitored Patient Re-evaluated:Patient Re-evaluated prior to induction Oxygen Delivery Method: Circle system utilized Preoxygenation: Pre-oxygenation with 100% oxygen Induction Type: IV induction and Cricoid Pressure applied Ventilation: Mask ventilation without difficulty and Oral airway inserted - appropriate to patient size Grade View: Grade I Tube type: Oral Tube size: 7.5 mm Number of attempts: 1 Airway Equipment and Method: Stylet and Oral airway Placement Confirmation: ETT inserted through vocal cords under direct vision, positive ETCO2 and breath sounds checked- equal and bilateral Secured at: 21 cm Tube secured with: Tape Dental Injury: Teeth and Oropharynx as per pre-operative assessment

## 2023-02-18 ENCOUNTER — Encounter (HOSPITAL_COMMUNITY): Payer: Self-pay | Admitting: Surgery

## 2023-02-19 LAB — SURGICAL PATHOLOGY

## 2023-02-19 NOTE — Anesthesia Postprocedure Evaluation (Signed)
Anesthesia Post Note  Patient: Rachel Stewart  Procedure(s) Performed: LAPAROSCOPIC CHOLECYSTECTOMY WITH INTRAOPERATIVE CHOLANGIOGRAM Closure of retro-roux mesenteric defect     Patient location during evaluation: PACU Anesthesia Type: General Level of consciousness: awake and alert Pain management: pain level controlled Vital Signs Assessment: post-procedure vital signs reviewed and stable Respiratory status: spontaneous breathing, nonlabored ventilation, respiratory function stable and patient connected to nasal cannula oxygen Cardiovascular status: blood pressure returned to baseline and stable Postop Assessment: no apparent nausea or vomiting Anesthetic complications: no   No notable events documented.  Last Vitals:  Vitals:   02/17/23 1800 02/17/23 1815  BP: (!) 164/97 (!) 164/97  Pulse: 79 77  Resp: 17 15  Temp:  (!) 36.4 C  SpO2: 90% 92%    Last Pain:  Vitals:   02/17/23 1800  PainSc: 3                  Miyeko Mahlum P Tashayla Therien

## 2023-03-18 ENCOUNTER — Encounter: Payer: Self-pay | Admitting: "Endocrinology

## 2023-03-18 ENCOUNTER — Other Ambulatory Visit: Payer: Self-pay | Admitting: "Endocrinology

## 2023-03-18 ENCOUNTER — Ambulatory Visit (INDEPENDENT_AMBULATORY_CARE_PROVIDER_SITE_OTHER): Payer: BC Managed Care – PPO | Admitting: "Endocrinology

## 2023-03-18 VITALS — BP 108/74 | HR 88 | Ht 64.0 in | Wt 196.2 lb

## 2023-03-18 DIAGNOSIS — E782 Mixed hyperlipidemia: Secondary | ICD-10-CM | POA: Diagnosis not present

## 2023-03-18 DIAGNOSIS — Z7985 Long-term (current) use of injectable non-insulin antidiabetic drugs: Secondary | ICD-10-CM

## 2023-03-18 DIAGNOSIS — E559 Vitamin D deficiency, unspecified: Secondary | ICD-10-CM

## 2023-03-18 DIAGNOSIS — E119 Type 2 diabetes mellitus without complications: Secondary | ICD-10-CM | POA: Diagnosis not present

## 2023-03-18 DIAGNOSIS — I10 Essential (primary) hypertension: Secondary | ICD-10-CM | POA: Diagnosis not present

## 2023-03-18 DIAGNOSIS — Z7984 Long term (current) use of oral hypoglycemic drugs: Secondary | ICD-10-CM

## 2023-03-18 MED ORDER — GLIMEPIRIDE 2 MG PO TABS: 2.0000 mg | ORAL_TABLET | Freq: Every day | ORAL | 1 refills | Status: DC

## 2023-03-18 MED ORDER — FREESTYLE LIBRE 3 PLUS SENSOR MISC: 2 refills | Status: DC

## 2023-03-18 MED ORDER — FREESTYLE LIBRE 3 READER DEVI: 1.0000 | Freq: Once | 0 refills | Status: DC | PRN

## 2023-03-18 NOTE — Progress Notes (Unsigned)
03/18/2023, 6:46 PM  Endocrinology follow-up note   Subjective:    Patient ID: Rachel Stewart, female    DOB: 1971/04/01.  Rachel Stewart is being seen in follow-up after she was seen in consultation for management of currently uncontrolled symptomatic diabetes requested by  Floydene Flock, NP.   Past Medical History:  Diagnosis Date   Anemia    Anxiety    Arthritis    Bronchitis, chronic (HCC)    last tx. 8'17   Chronic kidney disease    Complication of anesthesia    hard to wake up after anesthesia   Diabetes mellitus without complication (HCC)    GERD (gastroesophageal reflux disease)    History of kidney stones    Hypertension    Neuromuscular disorder (HCC)    "tic pain" left face near ear area. Carpal tunnel bilateral. Arthritis knee.   Sleep apnea    Ventral hernia    at present    Past Surgical History:  Procedure Laterality Date   BREAST SURGERY     breast reduction   CHOLECYSTECTOMY N/A 02/17/2023   Procedure: LAPAROSCOPIC CHOLECYSTECTOMY WITH INTRAOPERATIVE CHOLANGIOGRAM Closure of retro-roux mesenteric defect;  Surgeon: Quentin Ore, MD;  Location: [facility];  Service: General;  Laterality: N/A;   CYST EXCISION     left side of neck,  Benign cyst   LAPAROSCOPIC ROUX-EN-Y GASTRIC BYPASS WITH HIATAL HERNIA REPAIR N/A 05/19/2016   Procedure: LAPAROSCOPIC ROUX-EN-Y GASTRIC BYPASS WITH HIATAL HERNIA REPAIR, UPPER ENDO;  Surgeon: Luretha Murphy, MD;  Location: [facility];  Service: General;  Laterality: N/A;   SPINE SURGERY     lumbar decompression    Social History   Socioeconomic History   Marital status: Married    Spouse name: Not on file   Number of children: Not on file   Years of education: Not on file   Highest education level: Not on file  Occupational History   Not on file  Tobacco Use   Smoking status: Never   Smokeless tobacco: Never  Vaping Use   Vaping status: Never Used  Substance and Sexual Activity    Alcohol use: No   Drug use: No   Sexual activity: Yes  Other Topics Concern   Not on file  Social History Narrative   Not on file   Social Determinants of Health   Financial Resource Strain: Not on file  Food Insecurity: Not on file  Transportation Needs: Not on file  Physical Activity: Not on file  Stress: Not on file  Social Connections: Not on file    Family History  Problem Relation Age of Onset   Aneurysm Mother    Diabetes Mother    Hypertension Sister     Outpatient Encounter Medications as of 03/18/2023  Medication Sig   Continuous Glucose Receiver (FREESTYLE LIBRE 3 READER) DEVI 1 Piece by Does not apply route once as needed for up to 1 dose.   Continuous Glucose Sensor (FREESTYLE LIBRE 3 PLUS SENSOR) MISC Change sensor every 15 days.   Accu-Chek Softclix Lancets lancets Use as instructed to test blood glucose two times daily. Once in the morning and once at bedtime.   albuterol (VENTOLIN HFA) 108 (90 Base) MCG/ACT inhaler Inhale 2 puffs into the lungs every 6 (six) hours as needed for wheezing or shortness of breath.   atorvastatin (LIPITOR) 40 MG tablet Take 1 tablet by mouth daily.   Blood Glucose Monitoring Suppl (ACCU-CHEK GUIDE) w/Device  KIT Use to test blood glucose two times daily. Once in the morning and once at bedtime.   BREO ELLIPTA 200-25 MCG/ACT AEPB Inhale 1 puff into the lungs daily.   carvedilol (COREG) 6.25 MG tablet Take 6.25 mg by mouth 2 (two) times daily with a meal.   Cholecalciferol (VITAMIN D3) 125 MCG (5000 UT) CAPS Take 1 capsule (5,000 Units total) by mouth daily.   cyanocobalamin (,VITAMIN B-12,) 1000 MCG/ML injection Inject 1,000 mcg into the muscle every 30 (thirty) days.   Dulaglutide (TRULICITY) 1.5 MG/0.5ML SOPN Inject 1.5 mg into the skin once a week.   empagliflozin (JARDIANCE) 10 MG TABS tablet Take 1 tablet (10 mg total) by mouth daily before breakfast.   ferrous sulfate 325 (65 FE) MG tablet Take 650 mg by mouth daily with  breakfast.   glimepiride (AMARYL) 2 MG tablet Take 1 tablet (2 mg total) by mouth daily.   glucose blood (ACCU-CHEK GUIDE) test strip Use as instructed   losartan-hydrochlorothiazide (HYZAAR) 50-12.5 MG tablet Take 1 tablet by mouth daily.   meloxicam (MOBIC) 15 MG tablet Take 15 mg by mouth daily as needed for pain.   Multiple Vitamin (MULTIVITAMIN WITH MINERALS) TABS tablet Take 1 tablet by mouth daily.   omeprazole (PRILOSEC) 40 MG capsule Take 40 mg by mouth every morning.   oxyCODONE-acetaminophen (PERCOCET) 5-325 MG tablet Take 1 tablet by mouth every 4 (four) hours as needed for severe pain.   tiZANidine (ZANAFLEX) 2 MG tablet Take 1 tablet by mouth at bedtime as needed for muscle spasms.   [DISCONTINUED] glimepiride (AMARYL) 4 MG tablet Take 4 mg by mouth daily.   No facility-administered encounter medications on file as of 03/18/2023.    ALLERGIES: Allergies  Allergen Reactions   Lisinopril Cough    VACCINATION STATUS: Immunization History  Administered Date(s) Administered   Moderna Sars-Covid-2 Vaccination 09/14/2019, 10/12/2019    Diabetes She presents for her follow-up diabetic visit. She has type 2 diabetes mellitus. Onset time: She was diagnosed at approximate age of 33 years. Her disease course has been worsening. There are no hypoglycemic associated symptoms. Pertinent negatives for hypoglycemia include no confusion, headaches, pallor or seizures. Associated symptoms include fatigue. Pertinent negatives for diabetes include no chest pain, no polydipsia, no polyphagia and no polyuria. There are no hypoglycemic complications. Symptoms are worsening. There are no diabetic complications. Risk factors for coronary artery disease include diabetes mellitus, hypertension, obesity and sedentary lifestyle. Current diabetic treatments: She is currently oSaxenda 3 mg daily, glipizide 5 mg p.o. daily. She is compliant with treatment none of the time. Her weight is fluctuating minimally  (Patient with history of moderate obesity, weight up to 275 pounds before she underwent Roux-en-Y gastric bypass in 2006 which allowed her to lose more than 100 pounds, however, more recently she is gaining.). She is following a generally unhealthy diet. When asked about meal planning, she reported none. She has not had a previous visit with a dietitian. She participates in exercise intermittently. (She brought her meter showing only 2 blood glucose monitoring in the last 90 days.  Her point-of-care A1c is 8.6%.  ) An ACE inhibitor/angiotensin II receptor blocker is being taken. Eye exam is current.  Hypertension This is a chronic problem. The current episode started more than 1 year ago. The problem is controlled. Pertinent negatives include no chest pain, headaches, palpitations or shortness of breath. Risk factors for coronary artery disease include diabetes mellitus, sedentary lifestyle and obesity. Past treatments include angiotensin blockers.  Objective:       03/18/2023    4:10 PM 02/17/2023    6:15 PM 02/17/2023    6:00 PM  Vitals with BMI  Height 5\' 4"     Weight 196 lbs 3 oz    BMI 33.66    Systolic 108 164 161  Diastolic 74 97 97  Pulse 88 77 79    BP 108/74   Pulse 88   Ht 5\' 4"  (1.626 m)   Wt 196 lb 3.2 oz (89 kg)   BMI 33.68 kg/m   Wt Readings from Last 3 Encounters:  03/18/23 196 lb 3.2 oz (89 kg)  02/17/23 205 lb (93 kg)  02/13/23 205 lb (93 kg)     CMP ( most recent) CMP     Component Value Date/Time   NA 136 02/13/2023 1349   NA 136 07/16/2022 1436   K 3.6 02/13/2023 1349   CL 104 02/13/2023 1349   CO2 24 02/13/2023 1349   GLUCOSE 151 (H) 02/13/2023 1349   BUN 22 (H) 02/13/2023 1349   BUN 15 07/16/2022 1436   CREATININE 0.77 02/13/2023 1349   CREATININE 0.76 06/18/2018 1608   CALCIUM 8.9 02/13/2023 1349   PROT 7.4 07/16/2022 1436   ALBUMIN 3.5 (L) 07/16/2022 1436   AST 17 07/16/2022 1436   ALT 27 07/16/2022 1436   ALKPHOS 98 07/16/2022 1436    BILITOT 0.3 07/16/2022 1436   GFRNONAA >60 02/13/2023 1349   GFRNONAA 92 06/18/2018 1608   GFRAA >60 08/15/2019 0000   GFRAA 107 06/18/2018 1608     Diabetic Labs (most recent): Lab Results  Component Value Date   HGBA1C 7.4 (H) 02/13/2023   HGBA1C 8.6 (A) 12/15/2022   HGBA1C 8.6 (A) 08/06/2022   MICROALBUR 1.6 06/18/2018     Lipid Panel ( most recent) Lipid Panel     Component Value Date/Time   CHOL 167 07/16/2022 1436   TRIG 194 (H) 07/16/2022 1436   HDL 47 07/16/2022 1436   CHOLHDL 3.6 07/16/2022 1436   CHOLHDL 4 06/18/2018 1608   VLDL 25.4 06/18/2018 1608   LDLCALC 87 07/16/2022 1436   LABVLDL 33 07/16/2022 1436      Lab Results  Component Value Date   TSH 0.766 12/23/2021   TSH 0.957 02/26/2021   TSH 0.836 08/16/2020   TSH 0.93 08/15/2019   FREET4 1.24 12/23/2021   FREET4 1.23 02/26/2021   FREET4 1.30 08/16/2020      Assessment & Plan:   1. Type 2 diabetes mellitus without complication, without long-term current use of insulin (HCC)  - Rachel Stewart has currently uncontrolled symptomatic type 2 DM since  52 years of age.  She brought her meter showing only 2 blood glucose monitoring in the last 90 days.  Her point-of-care A1c is 8.6%.    - I had a long discussion with her about the progressive nature of diabetes and the pathology behind its complications. -her diabetes is complicated by obesity and she remains at a high risk for more acute and chronic complications which include CAD, CVA, CKD, retinopathy, and neuropathy. These are all discussed in detail with her.  - I have counseled her on diet  and weight management  by adopting a carbohydrate restricted/protein rich diet. Patient is encouraged to switch to  unprocessed or minimally processed     complex starch and increased protein intake (animal or plant source), fruits, and vegetables. -  she is advised to stick to a routine mealtimes to eat  3 meals  a day and avoid unnecessary snacks ( to snack  only to correct hypoglycemia).   - she acknowledges that there is a room for improvement in her food and drink choices. - Suggestion is made for her to avoid simple carbohydrates  from her diet including Cakes, Sweet Desserts, Ice Cream, Soda (diet and regular), Sweet Tea, Candies, Chips, Cookies, Store Bought Juices, Alcohol , Artificial Sweeteners,  Coffee Creamer, and "Sugar-free" Products, Lemonade. This will help patient to have more stable blood glucose profile and potentially avoid unintended weight gain.  The following Lifestyle Medicine recommendations according to American College of Lifestyle Medicine  [facility]) were discussed and and offered to patient and she  agrees to start the journey:  A. Whole Foods, Plant-Based Nutrition comprising of fruits and vegetables, plant-based proteins, whole-grain carbohydrates was discussed in detail with the patient.   A list for source of those nutrients were also provided to the patient.  Patient will use only water or unsweetened tea for hydration. B.  The need to stay away from risky substances including alcohol, smoking; obtaining 7 to 9 hours of restorative sleep, at least 150 minutes of moderate intensity exercise weekly, the importance of healthy social connections,  and stress management techniques were discussed. C.  A full color page of  Calorie density of various food groups per pound showing examples of each food groups was provided to the patient.    - she has been scheduled with Norm Salt, RDN, CDE for diabetes education.  - I have approached her with the following individualized plan to manage  her diabetes and patient agrees:   -In light of her presentation with uncontrolled glycemic profile, she is approached for more treatment.  Generally, she displays major dislike for medications and underwent lab for medication optimization specially for initiation and titration of insulin treatment.    -Accordingly, I discussed and added  Jardiance 10 mg p.o. daily at bedtime.  Side effects and precautions discussed with her.  She is advised to continue glipizide 5 mg XL p.o. daily at breakfast.  -She did have problems obtaining the prescribed dose of Trulicity.  I advised her to stay on Trulicity 1.5 mg subcutaneously weekly.   She is approached and she agrees to monitor blood glucose twice a day-daily before breakfast and at bedtime.    - she is encouraged to call clinic for blood glucose levels less than 70 or above 150 mg daily at on 3 days a week fasting.    - Specific targets for  A1c;  LDL, HDL,  and Triglycerides were discussed with the patient.  2) Blood Pressure /Hypertension:  Her blood pressure is controlled to target.  she is advised to continue her current medications including losartan 50 mg p.o. daily with breakfast .  3) Lipids/Hyperlipidemia:   Her previsit lipid panel showed controlled LDL at 87.  she is on statin prescribed by her PCP.    Lifestyle medicine described above will help control lipids.     4)  Weight/Diet:  Body mass index is 33.68 kg/m.  -   clearly complicating her diabetes care.   she is  a candidate for weight loss. I discussed with her the fact that loss of 5 - 10% of her  current body weight will have the most impact on her diabetes management.  Exercise, and detailed carbohydrates information provided  -  detailed on discharge instructions.  Patient has lost more than 100 pounds with her to her last  gastric bypass, currently retaining.  5) Chronic Care/Health Maintenance:  -she  is on ARB medications and  is encouraged to initiate and continue to follow up with Ophthalmology, Dentist,  Podiatrist at least yearly or according to recommendations, and advised to   stay away from smoking. I have recommended yearly flu vaccine and pneumonia vaccine at least every 5 years; moderate intensity exercise for up to 150 minutes weekly; and  sleep for at least 7 hours a day. Her screening ABI was  normal in March 2022.  Her study will be repeated in March 2027 or sooner if needed.   - she is  advised to maintain close follow up with Floydene Flock, NP for primary care needs, as well as her other providers for optimal and coordinated care.   I spent  25  minutes in the care of the patient today including review of labs from CMP, Lipids, Thyroid Function, Hematology (current and previous including abstractions from other facilities); face-to-face time discussing  her blood glucose readings/logs, discussing hypoglycemia and hyperglycemia episodes and symptoms, medications doses, her options of short and long term treatment based on the latest standards of care / guidelines;  discussion about incorporating lifestyle medicine;  and documenting the encounter. Risk reduction counseling performed per USPSTF guidelines to reduce  obesity and cardiovascular risk factors.     Please refer to Patient Instructions for Blood Glucose Monitoring and Insulin/Medications Dosing Guide"  in media tab for additional information. Please  also refer to " Patient Self Inventory" in the Media  tab for reviewed elements of pertinent patient history.  Rachel Stewart participated in the discussions, expressed understanding, and voiced agreement with the above plans.  All questions were answered to her satisfaction. she is encouraged to contact clinic should she have any questions or concerns prior to her return visit.   Follow up plan: - Return in about 4 months (around 07/19/2023) for F/U with Pre-visit Labs, Meter/CGM/Logs, A1c here.  Marquis Lunch, MD [facility] [address] [place], [place] [address] Phone: [phone]  Fax: [phone]    03/18/2023, 6:46 PM  This note was partially dictated with voice recognition software. Similar sounding words can be transcribed inadequately or may not  be corrected upon review.

## 2023-03-18 NOTE — Patient Instructions (Signed)

## 2023-04-06 ENCOUNTER — Telehealth: Payer: Self-pay

## 2023-04-06 ENCOUNTER — Other Ambulatory Visit (HOSPITAL_COMMUNITY): Payer: Self-pay

## 2023-04-06 NOTE — Telephone Encounter (Signed)
PA request has been Submitted. New Encounter created for follow up. For additional info see Pharmacy Prior Auth telephone encounter from 10/28.

## 2023-04-06 NOTE — Telephone Encounter (Signed)
*  Endo  Pharmacy Patient Advocate Encounter   Received notification from RX Request Messages that prior authorization for FreeStyle Libre 3 Reader device  is required/requested.   Insurance verification completed.   The patient is insured through [facility] .   Per test claim: PA required; PA started via CoverMyMeds. KEY [id number] . Waiting for clinical questions to populate.

## 2023-04-06 NOTE — Telephone Encounter (Signed)
*  Endo  Pharmacy Patient Advocate Encounter   Received notification from RX Request Messages that prior authorization for FreeStyle Libre 3 Sensor  is required/requested.   Insurance verification completed.   The patient is insured through [facility] .   Per test claim: PA required; PA started via CoverMyMeds. KEY [id number] . Waiting for clinical questions to populate.

## 2023-04-10 NOTE — Telephone Encounter (Signed)
Pharmacy Patient Advocate Encounter  Received notification from [facility] that Prior Authorization for [facility] 3 has been DENIED.  See denial reason below. No denial letter attached in CMM. Will attache denial letter to Media tab once received.

## 2023-04-13 NOTE — Telephone Encounter (Signed)
Tried to call pt but did not receive an answer and was unable to leave a voicemail.

## 2023-07-16 ENCOUNTER — Other Ambulatory Visit: Payer: Self-pay | Admitting: "Endocrinology

## 2023-07-23 LAB — LAB REPORT - SCANNED: Calcium: 9.8

## 2023-07-23 LAB — BASIC METABOLIC PANEL WITH GFR
BUN: 15 (ref 4–21)
Creatinine: 0.9 (ref 0.5–1.1)

## 2023-07-23 LAB — LIPID PANEL
Cholesterol: 89 (ref 0–200)
HDL: 49 (ref 35–70)
LDL Cholesterol: 25
Triglycerides: 73 (ref 40–160)

## 2023-07-27 ENCOUNTER — Encounter: Payer: Self-pay | Admitting: "Endocrinology

## 2023-07-27 ENCOUNTER — Ambulatory Visit: Payer: BC Managed Care – PPO | Admitting: "Endocrinology

## 2023-07-27 VITALS — BP 136/82 | HR 80 | Ht 64.0 in | Wt 200.8 lb

## 2023-07-27 DIAGNOSIS — Z6835 Body mass index (BMI) 35.0-35.9, adult: Secondary | ICD-10-CM

## 2023-07-27 DIAGNOSIS — E782 Mixed hyperlipidemia: Secondary | ICD-10-CM

## 2023-07-27 DIAGNOSIS — E559 Vitamin D deficiency, unspecified: Secondary | ICD-10-CM | POA: Diagnosis not present

## 2023-07-27 DIAGNOSIS — Z7985 Long-term (current) use of injectable non-insulin antidiabetic drugs: Secondary | ICD-10-CM | POA: Insufficient documentation

## 2023-07-27 DIAGNOSIS — E119 Type 2 diabetes mellitus without complications: Secondary | ICD-10-CM

## 2023-07-27 DIAGNOSIS — I10 Essential (primary) hypertension: Secondary | ICD-10-CM | POA: Diagnosis not present

## 2023-07-27 DIAGNOSIS — E66812 Obesity, class 2: Secondary | ICD-10-CM

## 2023-07-27 LAB — POCT GLYCOSYLATED HEMOGLOBIN (HGB A1C)

## 2023-07-27 NOTE — Patient Instructions (Signed)

## 2023-07-27 NOTE — Progress Notes (Signed)
07/27/2023, 4:15 PM  Endocrinology follow-up note   Subjective:    Patient ID: Rachel Stewart, female    DOB: [birth year].  Rachel Stewart is being seen in follow-up after she was seen in consultation for management of currently uncontrolled symptomatic diabetes requested by  Floydene Flock, NP.   Past Medical History:  Diagnosis Date   Anemia    Anxiety    Arthritis    Bronchitis, chronic (HCC)    last tx. 8'17   Chronic kidney disease    Complication of anesthesia    hard to wake up after anesthesia   Diabetes mellitus without complication (HCC)    GERD (gastroesophageal reflux disease)    History of kidney stones    Hypertension    Neuromuscular disorder (HCC)    "tic pain" left face near ear area. Carpal tunnel bilateral. Arthritis knee.   Sleep apnea    Ventral hernia    at present    Past Surgical History:  Procedure Laterality Date   BREAST SURGERY     breast reduction   CHOLECYSTECTOMY N/A 02/17/2023   Procedure: LAPAROSCOPIC CHOLECYSTECTOMY WITH INTRAOPERATIVE CHOLANGIOGRAM Closure of retro-roux mesenteric defect;  Surgeon: Quentin Ore, MD;  Location: [facility];  Service: General;  Laterality: N/A;   CYST EXCISION     left side of neck,  Benign cyst   LAPAROSCOPIC ROUX-EN-Y GASTRIC BYPASS WITH HIATAL HERNIA REPAIR N/A 05/19/2016   Procedure: LAPAROSCOPIC ROUX-EN-Y GASTRIC BYPASS WITH HIATAL HERNIA REPAIR, UPPER ENDO;  Surgeon: Luretha Murphy, MD;  Location: [facility];  Service: General;  Laterality: N/A;   SPINE SURGERY     lumbar decompression    Social History   Socioeconomic History   Marital status: Married    Spouse name: Not on file   Number of children: Not on file   Years of education: Not on file   Highest education level: Not on file  Occupational History   Not on file  Tobacco Use   Smoking status: Never   Smokeless tobacco: Never  Vaping Use   Vaping status: Never Used  Substance and Sexual Activity    Alcohol use: No   Drug use: No   Sexual activity: Yes  Other Topics Concern   Not on file  Social History Narrative   Not on file   Social Drivers of Health   Financial Resource Strain: Not on file  Food Insecurity: Not on file  Transportation Needs: Not on file  Physical Activity: Not on file  Stress: Not on file  Social Connections: Not on file    Family History  Problem Relation Age of Onset   Aneurysm Mother    Diabetes Mother    Hypertension Sister     Outpatient Encounter Medications as of 07/27/2023  Medication Sig   Accu-Chek Softclix Lancets lancets Use as instructed to test blood glucose two times daily. Once in the morning and once at bedtime.   albuterol (VENTOLIN HFA) 108 (90 Base) MCG/ACT inhaler Inhale 2 puffs into the lungs every 6 (six) hours as needed for wheezing or shortness of breath.   atorvastatin (LIPITOR) 40 MG tablet Take 1 tablet by mouth daily.   Blood Glucose Monitoring Suppl (ACCU-CHEK GUIDE) w/Device KIT Use to test blood glucose two times daily. Once in the morning and once at bedtime.   BREO ELLIPTA 200-25 MCG/ACT AEPB Inhale 1 puff into the lungs daily.   carvedilol (COREG) 6.25 MG tablet Take 6.25 mg  by mouth 2 (two) times daily with a meal.   Cholecalciferol (VITAMIN D3) 125 MCG (5000 UT) CAPS Take 1 capsule (5,000 Units total) by mouth daily.   Continuous Glucose Receiver (FREESTYLE LIBRE 3 READER) DEVI 1 Piece by Does not apply route once as needed for up to 1 dose. E11.65   Continuous Glucose Sensor (FREESTYLE LIBRE 3 PLUS SENSOR) MISC CHANGE SENSOR EVERY 15 DAYS.   cyanocobalamin (,VITAMIN B-12,) 1000 MCG/ML injection Inject 1,000 mcg into the muscle every 30 (thirty) days.   empagliflozin (JARDIANCE) 10 MG TABS tablet TAKE 1 TABLET BY MOUTH DAILY BEFORE BREAKFAST.   ferrous sulfate 325 (65 FE) MG tablet Take 650 mg by mouth daily with breakfast.   glucose blood (ACCU-CHEK GUIDE) test strip Use as instructed   losartan-hydrochlorothiazide  (HYZAAR) 50-12.5 MG tablet Take 1 tablet by mouth daily.   meloxicam (MOBIC) 15 MG tablet Take 15 mg by mouth daily as needed for pain.   MOUNJARO 7.5 MG/0.5ML Pen Inject 7.5 mg into the skin once a week.   Multiple Vitamin (MULTIVITAMIN WITH MINERALS) TABS tablet Take 1 tablet by mouth daily.   omeprazole (PRILOSEC) 40 MG capsule Take 40 mg by mouth every morning.   tiZANidine (ZANAFLEX) 2 MG tablet Take 1 tablet by mouth at bedtime as needed for muscle spasms.   [DISCONTINUED] Dulaglutide (TRULICITY) 1.5 MG/0.5ML SOPN Inject 1.5 mg into the skin once a week.   [DISCONTINUED] glimepiride (AMARYL) 2 MG tablet Take 1 tablet (2 mg total) by mouth daily.   [DISCONTINUED] oxyCODONE-acetaminophen (PERCOCET) 5-325 MG tablet Take 1 tablet by mouth every 4 (four) hours as needed for severe pain.   No facility-administered encounter medications on file as of 07/27/2023.    ALLERGIES: Allergies  Allergen Reactions   Lisinopril Cough    VACCINATION STATUS: Immunization History  Administered Date(s) Administered   Moderna Sars-Covid-2 Vaccination 09/14/2019, 10/12/2019    Diabetes She presents for her follow-up diabetic visit. She has type 2 diabetes mellitus. Onset time: She was diagnosed at approximate age of 33 years. Her disease course has been improving. There are no hypoglycemic associated symptoms. Pertinent negatives for hypoglycemia include no confusion, headaches, pallor or seizures. Associated symptoms include fatigue. Pertinent negatives for diabetes include no chest pain, no polydipsia, no polyphagia and no polyuria. There are no hypoglycemic complications. Symptoms are improving. There are no diabetic complications. Risk factors for coronary artery disease include diabetes mellitus, hypertension, obesity and sedentary lifestyle. Current diabetic treatments: She is currently oSaxenda 3 mg daily, glipizide 5 mg p.o. daily. She is compliant with treatment none of the time. Her weight is  fluctuating minimally (Patient with history of moderate obesity, weight up to 275 pounds before she underwent Roux-en-Y gastric bypass in 2006 which allowed her to lose more than 100 pounds, however, more recently she is gaining.). She is following a generally unhealthy diet. When asked about meal planning, she reported none. She has not had a previous visit with a dietitian. She participates in exercise intermittently. Her home blood glucose trend is decreasing steadily. (She did not bring any logs nor meter.  Her point-of-care was 6.3% improving from 8.6%.  She denies hypoglycemia, however she admits that she is not monitoring blood glucose regularly.   ) An ACE inhibitor/angiotensin II receptor blocker is being taken. Eye exam is current.  Hypertension This is a chronic problem. The current episode started more than 1 year ago. The problem is controlled. Pertinent negatives include no chest pain, headaches, palpitations or shortness of  breath. Risk factors for coronary artery disease include diabetes mellitus, sedentary lifestyle and obesity. Past treatments include angiotensin blockers.      Objective:       07/27/2023    3:53 PM 03/18/2023    4:10 PM 02/17/2023    6:15 PM  Vitals with BMI  Height 5\' 4"  5\' 4"    Weight 200 lbs 13 oz 196 lbs 3 oz   BMI 34.45 33.66   Systolic 136 108 161  Diastolic 82 74 97  Pulse 80 88 77    BP 136/82   Pulse 80   Ht 5\' 4"  (1.626 m)   Wt 200 lb 12.8 oz (91.1 kg)   BMI 34.47 kg/m   Wt Readings from Last 3 Encounters:  07/27/23 200 lb 12.8 oz (91.1 kg)  03/18/23 196 lb 3.2 oz (89 kg)  02/17/23 205 lb (93 kg)     CMP ( most recent) CMP     Component Value Date/Time   NA 136 02/13/2023 1349   NA 136 07/16/2022 1436   K 3.6 02/13/2023 1349   CL 104 02/13/2023 1349   CO2 24 02/13/2023 1349   GLUCOSE 151 (H) 02/13/2023 1349   BUN 22 (H) 02/13/2023 1349   BUN 15 07/16/2022 1436   CREATININE 0.77 02/13/2023 1349   CREATININE 0.76 06/18/2018  1608   CALCIUM 9.8 07/23/2023 0000   PROT 7.4 07/16/2022 1436   ALBUMIN 3.5 (L) 07/16/2022 1436   AST 17 07/16/2022 1436   ALT 27 07/16/2022 1436   ALKPHOS 98 07/16/2022 1436   BILITOT 0.3 07/16/2022 1436   GFRNONAA >60 02/13/2023 1349   GFRNONAA 92 06/18/2018 1608   GFRAA >60 08/15/2019 0000   GFRAA 107 06/18/2018 1608     Diabetic Labs (most recent): Lab Results  Component Value Date   HGBA1C 7.4 (H) 02/13/2023   HGBA1C 8.6 (A) 12/15/2022   HGBA1C 8.6 (A) 08/06/2022   MICROALBUR 1.6 06/18/2018     Lipid Panel ( most recent) Lipid Panel     Component Value Date/Time   CHOL 167 07/16/2022 1436   TRIG 194 (H) 07/16/2022 1436   HDL 47 07/16/2022 1436   CHOLHDL 3.6 07/16/2022 1436   CHOLHDL 4 06/18/2018 1608   VLDL 25.4 06/18/2018 1608   LDLCALC 87 07/16/2022 1436   LABVLDL 33 07/16/2022 1436      Lab Results  Component Value Date   TSH 0.766 12/23/2021   TSH 0.957 02/26/2021   TSH 0.836 08/16/2020   TSH 0.93 08/15/2019   FREET4 1.24 12/23/2021   FREET4 1.23 02/26/2021   FREET4 1.30 08/16/2020      Assessment & Plan:   1. Type 2 diabetes mellitus without complication, without long-term current use of insulin (HCC)  - Evita N Coop has currently uncontrolled symptomatic type 2 DM since  53 years of age.  She did not bring any logs nor meter.  Her point-of-care was 6.3% improving from 8.6%.  She denies hypoglycemia, however she admits that she is not monitoring blood glucose regularly.   - I had a long discussion with her about the progressive nature of diabetes and the pathology behind its complications. -her diabetes is complicated by obesity and she remains at a high risk for more acute and chronic complications which include CAD, CVA, CKD, retinopathy, and neuropathy. These are all discussed in detail with her.  - I have counseled her on diet  and weight management  by adopting a carbohydrate restricted/protein rich diet. Patient is  encouraged to  switch to  unprocessed or minimally processed     complex starch and increased protein intake (animal or plant source), fruits, and vegetables. -  she is advised to stick to a routine mealtimes to eat 3 meals  a day and avoid unnecessary snacks ( to snack only to correct hypoglycemia).   - she acknowledges that there is a room for improvement in her food and drink choices. - Suggestion is made for her to avoid simple carbohydrates  from her diet including Cakes, Sweet Desserts, Ice Cream, Soda (diet and regular), Sweet Tea, Candies, Chips, Cookies, Store Bought Juices, Alcohol , Artificial Sweeteners,  Coffee Creamer, and "Sugar-free" Products, Lemonade. This will help patient to have more stable blood glucose profile and potentially avoid unintended weight gain.  The following Lifestyle Medicine recommendations according to American College of Lifestyle Medicine  [facility]) were discussed and and offered to patient and she  agrees to start the journey:  A. Whole Foods, Plant-Based Nutrition comprising of fruits and vegetables, plant-based proteins, whole-grain carbohydrates was discussed in detail with the patient.   A list for source of those nutrients were also provided to the patient.  Patient will use only water or unsweetened tea for hydration. B.  The need to stay away from risky substances including alcohol, smoking; obtaining 7 to 9 hours of restorative sleep, at least 150 minutes of moderate intensity exercise weekly, the importance of healthy social connections,  and stress management techniques were discussed. C.  A full color page of  Calorie density of various food groups per pound showing examples of each food groups was provided to the patient.  - I have approached her with the following individualized plan to manage  her diabetes and patient agrees:   -In light of her known non-commitment for proper monitoring of blood glucose for safe use of medications, she presents a challenge for  optimal treatment of her diabetes.    -She is at risk of hypoglycemia.  She is advised to discontinue glimepiride at this time.  She will benefit from the higher dose of Mounjaro, discussed and increased her Mounjaro to 7.5 mg subcutaneously weekly.  She is also advised to continue Jardiance 10 mg p.o. daily at breakfast.   -She is urged to start monitoring blood glucose twice a day-daily before breakfast and at bedtime.  She also did not pick up her previous prescription for a CGM.     - she is encouraged to call clinic for blood glucose levels less than 70 or above 150 mg daily at on 3 days a week fasting.    - Specific targets for  A1c;  LDL, HDL,  and Triglycerides were discussed with the patient.  2) Blood Pressure /Hypertension:  -Her blood pressure is controlled to target.  she is advised to continue her current medications including losartan 50 mg p.o. daily with breakfast .  3) Lipids/Hyperlipidemia:   Her previsit lipid panel showed controlled LDL at 87.  She is advised to continue Lipitor 40 mg p.o. nightly.    Side effects and precautions discussed with her.    Lifestyle medicine described above will help control lipids.     4)  Weight/Diet:  Body mass index is 34.47 kg/m.  -   clearly complicating her diabetes care.   she is  a candidate for weight loss. I discussed with her the fact that loss of 5 - 10% of her  current body weight will have the most impact on  her diabetes management.  Exercise, and detailed carbohydrates information provided  -  detailed on discharge instructions.  Patient has lost more than 100 pounds with her to her last gastric bypass, currently retaining.  5) Chronic Care/Health Maintenance:  -she  is on ARB medications and  is encouraged to initiate and continue to follow up with Ophthalmology, Dentist,  Podiatrist at least yearly or according to recommendations, and advised to   stay away from smoking. I have recommended yearly flu vaccine and pneumonia  vaccine at least every 5 years; moderate intensity exercise for up to 150 minutes weekly; and  sleep for at least 7 hours a day. Her screening ABI was normal in March 2022.  Her study will be repeated in March 2027 or sooner if needed.   - she is  advised to maintain close follow up with Floydene Flock, NP for primary care needs, as well as her other providers for optimal and coordinated care.   I spent  26  minutes in the care of the patient today including review of labs from CMP, Lipids, Thyroid Function, Hematology (current and previous including abstractions from other facilities); face-to-face time discussing  her blood glucose readings/logs, discussing hypoglycemia and hyperglycemia episodes and symptoms, medications doses, her options of short and long term treatment based on the latest standards of care / guidelines;  discussion about incorporating lifestyle medicine;  and documenting the encounter. Risk reduction counseling performed per USPSTF guidelines to reduce  obesity and cardiovascular risk factors.     Please refer to Patient Instructions for Blood Glucose Monitoring and Insulin/Medications Dosing Guide"  in media tab for additional information. Please  also refer to " Patient Self Inventory" in the Media  tab for reviewed elements of pertinent patient history.  Rachel Stewart participated in the discussions, expressed understanding, and voiced agreement with the above plans.  All questions were answered to her satisfaction. she is encouraged to contact clinic should she have any questions or concerns prior to her return visit.     Follow up plan: - Return in about 4 months (around 11/24/2023) for Bring Meter/CGM Device/Logs- A1c in Office.  Marquis Lunch, MD [facility] [address] [place], [place] [address] Phone: [phone]  Fax: [phone]    07/27/2023, 4:15 PM  This note was partially dictated with voice  recognition software. Similar sounding words can be transcribed inadequately or may not  be corrected upon review.

## 2023-09-30 ENCOUNTER — Other Ambulatory Visit: Payer: Self-pay | Admitting: "Endocrinology

## 2023-11-11 ENCOUNTER — Other Ambulatory Visit: Payer: Self-pay | Admitting: "Endocrinology

## 2023-11-24 ENCOUNTER — Ambulatory Visit: Payer: BC Managed Care – PPO | Admitting: "Endocrinology

## 2023-12-10 ENCOUNTER — Encounter: Payer: Self-pay | Admitting: "Endocrinology

## 2023-12-10 ENCOUNTER — Ambulatory Visit: Admitting: "Endocrinology

## 2023-12-10 VITALS — BP 86/64 | HR 96 | Ht 64.0 in | Wt 197.4 lb

## 2023-12-10 DIAGNOSIS — E559 Vitamin D deficiency, unspecified: Secondary | ICD-10-CM | POA: Diagnosis not present

## 2023-12-10 DIAGNOSIS — E119 Type 2 diabetes mellitus without complications: Secondary | ICD-10-CM

## 2023-12-10 DIAGNOSIS — E66811 Obesity, class 1: Secondary | ICD-10-CM

## 2023-12-10 DIAGNOSIS — Z7984 Long term (current) use of oral hypoglycemic drugs: Secondary | ICD-10-CM

## 2023-12-10 DIAGNOSIS — E6609 Other obesity due to excess calories: Secondary | ICD-10-CM

## 2023-12-10 DIAGNOSIS — Z7985 Long-term (current) use of injectable non-insulin antidiabetic drugs: Secondary | ICD-10-CM | POA: Diagnosis not present

## 2023-12-10 DIAGNOSIS — Z6833 Body mass index (BMI) 33.0-33.9, adult: Secondary | ICD-10-CM

## 2023-12-10 DIAGNOSIS — E782 Mixed hyperlipidemia: Secondary | ICD-10-CM

## 2023-12-10 DIAGNOSIS — I10 Essential (primary) hypertension: Secondary | ICD-10-CM

## 2023-12-10 LAB — POCT GLYCOSYLATED HEMOGLOBIN (HGB A1C): HbA1c, POC (controlled diabetic range): 7.2 % — AB (ref 0.0–7.0)

## 2023-12-10 MED ORDER — TIRZEPATIDE 10 MG/0.5ML ~~LOC~~ SOAJ: 10.0000 mg | SUBCUTANEOUS | 1 refills | Status: DC

## 2023-12-10 NOTE — Progress Notes (Signed)
 12/10/2023, 11:08 AM  Endocrinology follow-up note   Subjective:    Patient ID: Rachel Stewart, female    DOB: 29-Dec-1970.  Rachel Stewart is being seen in follow-up after she was seen in consultation for management of currently uncontrolled symptomatic diabetes requested by  Joshua Lyons, NP.   Past Medical History:  Diagnosis Date   Anemia    Anxiety    Arthritis    Bronchitis, chronic (HCC)    last tx. 8'17   Chronic kidney disease    Complication of anesthesia    hard to wake up after anesthesia   Diabetes mellitus without complication (HCC)    GERD (gastroesophageal reflux disease)    History of kidney stones    Hypertension    Neuromuscular disorder (HCC)    tic pain left face near ear area. Carpal tunnel bilateral. Arthritis knee.   Sleep apnea    Ventral hernia    at present    Past Surgical History:  Procedure Laterality Date   BREAST SURGERY     breast reduction   CHOLECYSTECTOMY N/A 02/17/2023   Procedure: LAPAROSCOPIC CHOLECYSTECTOMY WITH INTRAOPERATIVE CHOLANGIOGRAM Closure of retro-roux mesenteric defect;  Surgeon: Lyndel Deward PARAS, MD;  Location: [facility];  Service: General;  Laterality: N/A;   CYST EXCISION     left side of neck,  Benign cyst   LAPAROSCOPIC ROUX-EN-Y GASTRIC BYPASS WITH HIATAL HERNIA REPAIR N/A 05/19/2016   Procedure: LAPAROSCOPIC ROUX-EN-Y GASTRIC BYPASS WITH HIATAL HERNIA REPAIR, UPPER ENDO;  Surgeon: Donnice Lunger, MD;  Location: [facility];  Service: General;  Laterality: N/A;   SPINE SURGERY     lumbar decompression    Social History   Socioeconomic History   Marital status: Married    Spouse name: Not on file   Number of children: Not on file   Years of education: Not on file   Highest education level: Not on file  Occupational History   Not on file  Tobacco Use   Smoking status: Never   Smokeless tobacco: Never  Vaping Use   Vaping status: Never Used  Substance and Sexual Activity    Alcohol use: No   Drug use: No   Sexual activity: Yes  Other Topics Concern   Not on file  Social History Narrative   Not on file   Social Drivers of Health   Financial Resource Strain: Not on file  Food Insecurity: Not on file  Transportation Needs: Not on file  Physical Activity: Not on file  Stress: Not on file  Social Connections: Not on file    Family History  Problem Relation Age of Onset   Aneurysm Mother    Diabetes Mother    Hypertension Sister     Outpatient Encounter Medications as of 12/10/2023  Medication Sig   levETIRAcetam (KEPPRA) 500 MG tablet Take 500 mg by mouth 2 (two) times daily.   tirzepatide (MOUNJARO) 10 MG/0.5ML Pen Inject 10 mg into the skin once a week.   Accu-Chek Softclix Lancets lancets Use as instructed to test blood glucose two times daily. Once in the morning and once at bedtime.   albuterol (VENTOLIN HFA) 108 (90 Base) MCG/ACT inhaler Inhale 2 puffs into the lungs every 6 (six) hours as needed for wheezing or shortness of breath.   atorvastatin (LIPITOR) 40 MG tablet Take 1 tablet by mouth daily.   Blood Glucose Monitoring Suppl (ACCU-CHEK GUIDE) w/Device KIT Use to test blood glucose two times daily.  Once in the morning and once at bedtime.   BREO ELLIPTA 200-25 MCG/ACT AEPB Inhale 1 puff into the lungs daily.   carvedilol (COREG) 6.25 MG tablet Take 6.25 mg by mouth 2 (two) times daily with a meal.   Cholecalciferol (VITAMIN D3) 125 MCG (5000 UT) CAPS Take 1 capsule (5,000 Units total) by mouth daily.   Continuous Glucose Receiver (FREESTYLE LIBRE 3 READER) DEVI 1 Piece by Does not apply route once as needed for up to 1 dose. E11.65   Continuous Glucose Sensor (FREESTYLE LIBRE 3 PLUS SENSOR) MISC CHANGE SENSOR EVERY 15 DAYS.   cyanocobalamin (,VITAMIN B-12,) 1000 MCG/ML injection Inject 1,000 mcg into the muscle every 30 (thirty) days.   ferrous sulfate 325 (65 FE) MG tablet Take 650 mg by mouth daily with breakfast.   glucose blood  (ACCU-CHEK GUIDE) test strip Use as instructed   JARDIANCE  10 MG TABS tablet TAKE 1 TABLET BY MOUTH DAILY BEFORE BREAKFAST.   losartan-hydrochlorothiazide (HYZAAR) 50-12.5 MG tablet Take 1 tablet by mouth daily.   meloxicam (MOBIC) 15 MG tablet Take 15 mg by mouth daily as needed for pain.   Multiple Vitamin (MULTIVITAMIN WITH MINERALS) TABS tablet Take 1 tablet by mouth daily.   omeprazole (PRILOSEC) 40 MG capsule Take 40 mg by mouth every morning.   tiZANidine (ZANAFLEX) 2 MG tablet Take 1 tablet by mouth at bedtime as needed for muscle spasms.   [DISCONTINUED] MOUNJARO 7.5 MG/0.5ML Pen Inject 7.5 mg into the skin once a week.   No facility-administered encounter medications on file as of 12/10/2023.    ALLERGIES: Allergies  Allergen Reactions   Carbamazepine, Eslicarbazepine, And Oxcarbazepine    Lisinopril Cough    VACCINATION STATUS: Immunization History  Administered Date(s) Administered   Moderna Sars-Covid-2 Vaccination 09/14/2019, 10/12/2019    Diabetes She presents for her follow-up diabetic visit. She has type 2 diabetes mellitus. Onset time: She was diagnosed at approximate age of 33 years. Her disease course has been worsening. There are no hypoglycemic associated symptoms. Pertinent negatives for hypoglycemia include no confusion, headaches, pallor or seizures. Pertinent negatives for diabetes include no chest pain, no fatigue, no polydipsia, no polyphagia and no polyuria. There are no hypoglycemic complications. Symptoms are worsening. There are no diabetic complications. Risk factors for coronary artery disease include diabetes mellitus, hypertension, obesity and sedentary lifestyle. She is compliant with treatment none of the time. Her weight is fluctuating minimally (Patient with history of moderate obesity, weight up to 275 pounds before she underwent Roux-en-Y gastric bypass in 2006 which allowed her to lose more than 100 pounds, however, more recently she is gaining.).  She is following a generally unhealthy diet. When asked about meal planning, she reported none. She has not had a previous visit with a dietitian. She participates in exercise intermittently. Her overall blood glucose range is 140-180 mg/dl. (Once again, she did not bring any logs or meter.  Her point-of-care A1c 7.2% increasing from 6.3% during her last visit.  She denies hypoglycemia.     ) An ACE inhibitor/angiotensin II receptor blocker is being taken. Eye exam is current.  Hypertension This is a chronic problem. The current episode started more than 1 year ago. The problem is controlled. Pertinent negatives include no chest pain, headaches, palpitations or shortness of breath. Risk factors for coronary artery disease include diabetes mellitus, sedentary lifestyle and obesity. Past treatments include angiotensin blockers.     Objective:       12/10/2023   10:43 AM 12/10/2023  10:22 AM 07/27/2023    3:53 PM  Vitals with BMI  Height  5' 4 5' 4  Weight  197 lbs 6 oz 200 lbs 13 oz  BMI  33.87 34.45  Systolic 86 86 136  Diastolic 64 60 82  Pulse  96 80    BP (!) 86/64 Comment: R arm with manual cuff. Dr.Jeslynn Hollander made aware.  Pulse 96   Ht 5' 4 (1.626 m)   Wt 197 lb 6.4 oz (89.5 kg)   BMI 33.88 kg/m   Wt Readings from Last 3 Encounters:  12/10/23 197 lb 6.4 oz (89.5 kg)  07/27/23 200 lb 12.8 oz (91.1 kg)  03/18/23 196 lb 3.2 oz (89 kg)     CMP ( most recent) CMP     Component Value Date/Time   NA 136 02/13/2023 1349   NA 136 07/16/2022 1436   K 3.6 02/13/2023 1349   CL 104 02/13/2023 1349   CO2 24 02/13/2023 1349   GLUCOSE 151 (H) 02/13/2023 1349   BUN 15 07/23/2023 0000   CREATININE 0.9 07/23/2023 0000   CREATININE 0.77 02/13/2023 1349   CREATININE 0.76 06/18/2018 1608   CALCIUM 9.8 07/23/2023 0000   PROT 7.4 07/16/2022 1436   ALBUMIN  3.5 (L) 07/16/2022 1436   AST 17 07/16/2022 1436   ALT 27 07/16/2022 1436   ALKPHOS 98 07/16/2022 1436   BILITOT 0.3 07/16/2022 1436    GFRNONAA >60 02/13/2023 1349   GFRNONAA 92 06/18/2018 1608   GFRAA >60 08/15/2019 0000   GFRAA 107 06/18/2018 1608     Diabetic Labs (most recent): Lab Results  Component Value Date   HGBA1C 7.2 (A) 12/10/2023   HGBA1C 7.4 (H) 02/13/2023   HGBA1C 8.6 (A) 12/15/2022     Lipid Panel ( most recent) Lipid Panel     Component Value Date/Time   CHOL 89 07/23/2023 0000   CHOL 167 07/16/2022 1436   TRIG 73 07/23/2023 0000   HDL 49 07/23/2023 0000   HDL 47 07/16/2022 1436   CHOLHDL 3.6 07/16/2022 1436   CHOLHDL 4 06/18/2018 1608   VLDL 25.4 06/18/2018 1608   LDLCALC 25 07/23/2023 0000   LDLCALC 87 07/16/2022 1436   LABVLDL 33 07/16/2022 1436      Lab Results  Component Value Date   TSH 0.766 12/23/2021   TSH 0.957 02/26/2021   TSH 0.836 08/16/2020   TSH 0.93 08/15/2019   FREET4 1.24 12/23/2021   FREET4 1.23 02/26/2021   FREET4 1.30 08/16/2020      Assessment & Plan:   1. Type 2 diabetes mellitus without complication, without long-term current use of insulin  (HCC)  - Rachel Stewart has currently uncontrolled symptomatic type 2 DM since  53 years of age.  Once again, she did not bring any logs or meter.  Her point-of-care A1c 7.2% increasing from 6.3% during her last visit.  She denies hypoglycemia.    - I had a long discussion with her about the progressive nature of diabetes and the pathology behind its complications. -her diabetes is complicated by obesity and she remains at a high risk for more acute and chronic complications which include CAD, CVA, CKD, retinopathy, and neuropathy. These are all discussed in detail with her.  - I have counseled her on diet  and weight management  by adopting a carbohydrate restricted/protein rich diet. Patient is encouraged to switch to  unprocessed or minimally processed     complex starch and increased protein intake (animal or plant source),  fruits, and vegetables. -  she is advised to stick to a routine mealtimes to eat 3  meals  a day and avoid unnecessary snacks ( to snack only to correct hypoglycemia).   - she acknowledges that there is a room for improvement in her food and drink choices. - Suggestion is made for her to avoid simple carbohydrates  from her diet including Cakes, Sweet Desserts, Ice Cream, Soda (diet and regular), Sweet Tea, Candies, Chips, Cookies, Store Bought Juices, Alcohol , Artificial Sweeteners,  Coffee Creamer, and Sugar-free Products, Lemonade. This will help patient to have more stable blood glucose profile and potentially avoid unintended weight gain.  - I have approached her with the following individualized plan to manage  her diabetes and patient agrees:   -In light of her known non-commitment for proper monitoring of blood glucose for safe use of medications, she presents a challenge for optimal treatment of her diabetes.    - To minimize her risk of hypoglycemia from inadvertent use of hypoglycemic agents she will be kept on non-insulin  treatment.   - I discussed and increase her Mounjaro to 10 mg subcutaneously weekly.   - She is also on Jardiance  10 mg p.o. daily at breakfast.   Low this is a -She is urged to start monitoring blood glucose twice a day-daily before breakfast and at bedtime.  She also did not pick up her previous prescription for a CGM.     - she is encouraged to call clinic for blood glucose levels less than 70 or above 150 mg daily at on 3 days a week fasting.    - Specific targets for  A1c;  LDL, HDL,  and Triglycerides were discussed with the patient.  2) Blood Pressure /Hypertension:  Her blood pressure is tightly controlled.  She has taken a large dose of carvedilol this morning-both of her carvedilol 6.25 mg tablets with breakfast this morning, advised her to take this tablets separately twice a day along with her losartan/HCTZ 50-12.5 mg p.o. daily.  She does have appointment with her PCP in 2 weeks.  Her blood pressure this morning was 86/61,  asymptomatic . if her blood pressure remains low she can be considered off of beta-blocker.   3) Lipids/Hyperlipidemia:   Her previsit lipid panel showed controlled LDL at 25.  She remains on Lipitor 40 mg p.o. nightly.   4)  Weight/Diet:  Body mass index is 33.88 kg/m.  -   clearly complicating her diabetes care.   she is  a candidate for weight loss. I discussed with her the fact that loss of 5 - 10% of her  current body weight will have the most impact on her diabetes management.  Exercise, and detailed carbohydrates information provided  -  detailed on discharge instructions.  Patient has lost more than 100 pounds with her to her last gastric bypass, currently retaining.  5) Chronic Care/Health Maintenance:  -she  is on ARB medications and  is encouraged to initiate and continue to follow up with Ophthalmology, Dentist,  Podiatrist at least yearly or according to recommendations, and advised to   stay away from smoking. I have recommended yearly flu vaccine and pneumonia vaccine at least every 5 years; moderate intensity exercise for up to 150 minutes weekly; and  sleep for at least 7 hours a day. Her screening ABI was normal in March 2022.  Her study will be repeated in March 2027 or sooner if needed.   - she is  advised to  maintain close follow up with Joshua Lyons, NP for primary care needs, as well as her other providers for optimal and coordinated care.   I spent  26  minutes in the care of the patient today including review of labs from CMP, Lipids, Thyroid Function, Hematology (current and previous including abstractions from other facilities); face-to-face time discussing  her blood glucose readings/logs, discussing hypoglycemia and hyperglycemia episodes and symptoms, medications doses, her options of short and long term treatment based on the latest standards of care / guidelines;  discussion about incorporating lifestyle medicine;  and documenting the encounter. Risk reduction  counseling performed per USPSTF guidelines to reduce  obesity and cardiovascular risk factors.     Please refer to Patient Instructions for Blood Glucose Monitoring and Insulin /Medications Dosing Guide  in media tab for additional information. Please  also refer to  Patient Self Inventory in the Media  tab for reviewed elements of pertinent patient history.  Rachel Stewart participated in the discussions, expressed understanding, and voiced agreement with the above plans.  All questions were answered to her satisfaction. she is encouraged to contact clinic should she have any questions or concerns prior to her return visit.     Follow up plan: - Return in about 4 months (around 04/11/2024) for F/U with Pre-visit Labs, Meter/CGM/Logs, A1c here.  Ranny Earl, MD [facility] [address] [place], [place] [address] Phone: [phone]  Fax: [phone]    12/10/2023, 11:08 AM  This note was partially dictated with voice recognition software. Similar sounding words can be transcribed inadequately or may not  be corrected upon review.

## 2023-12-10 NOTE — Patient Instructions (Signed)

## 2023-12-14 ENCOUNTER — Telehealth: Payer: Self-pay

## 2023-12-14 NOTE — Telephone Encounter (Signed)
 Pharmacy Patient Advocate Encounter   Received notification from CoverMyMeds that prior authorization for Mounjaro  10mg  is required/requested.   Insurance verification completed.   The patient is insured through [employer] .   Per test claim: PA required; PA submitted to above mentioned insurance via CoverMyMeds Key/confirmation #/EOC [id number] Status is pending

## 2023-12-14 NOTE — Telephone Encounter (Signed)
 Pharmacy Patient Advocate Encounter  Received notification from [employer] that Prior Authorization for Mounjaro  10mg  has been APPROVED from 12/14/23 to 12/13/24

## 2023-12-15 NOTE — Telephone Encounter (Signed)
Pt made aware via MyChart message 

## 2024-01-23 ENCOUNTER — Other Ambulatory Visit: Payer: Self-pay | Admitting: "Endocrinology

## 2024-03-23 ENCOUNTER — Other Ambulatory Visit: Payer: Self-pay | Admitting: "Endocrinology

## 2024-04-13 LAB — VITAMIN D 25 HYDROXY (VIT D DEFICIENCY, FRACTURES): Vit D, 25-Hydroxy: 38

## 2024-04-13 LAB — LAB REPORT - SCANNED
EGFR: 80
Free T4: 1.33 ng/dL
TSH: 0.94

## 2024-04-13 LAB — BASIC METABOLIC PANEL WITH GFR
BUN: 19 (ref 4–21)
Creatinine: 0.9 (ref 0.5–1.1)
Potassium: 3.7 meq/L (ref 3.5–5.1)
Sodium: 138 (ref 137–147)

## 2024-04-13 LAB — COMPREHENSIVE METABOLIC PANEL WITH GFR: Calcium: 9.3 (ref 8.7–10.7)

## 2024-04-13 LAB — HEPATIC FUNCTION PANEL: Alkaline Phosphatase: 101 (ref 25–125)

## 2024-04-19 ENCOUNTER — Encounter: Payer: Self-pay | Admitting: "Endocrinology

## 2024-04-19 ENCOUNTER — Ambulatory Visit (INDEPENDENT_AMBULATORY_CARE_PROVIDER_SITE_OTHER): Admitting: "Endocrinology

## 2024-04-19 VITALS — BP 90/68 | HR 88 | Ht 64.0 in | Wt 190.0 lb

## 2024-04-19 DIAGNOSIS — E559 Vitamin D deficiency, unspecified: Secondary | ICD-10-CM

## 2024-04-19 DIAGNOSIS — I10 Essential (primary) hypertension: Secondary | ICD-10-CM | POA: Diagnosis not present

## 2024-04-19 DIAGNOSIS — E119 Type 2 diabetes mellitus without complications: Secondary | ICD-10-CM

## 2024-04-19 DIAGNOSIS — E782 Mixed hyperlipidemia: Secondary | ICD-10-CM | POA: Diagnosis not present

## 2024-04-19 DIAGNOSIS — Z7985 Long-term (current) use of injectable non-insulin antidiabetic drugs: Secondary | ICD-10-CM

## 2024-04-19 LAB — POCT GLYCOSYLATED HEMOGLOBIN (HGB A1C)

## 2024-04-19 MED ORDER — TIRZEPATIDE 12.5 MG/0.5ML ~~LOC~~ SOAJ: 12.5000 mg | SUBCUTANEOUS | 1 refills | Status: AC

## 2024-04-19 MED ORDER — CARVEDILOL 3.125 MG PO TABS: 3.1250 mg | ORAL_TABLET | Freq: Two times a day (BID) | ORAL | 1 refills | Status: AC

## 2024-04-19 NOTE — Progress Notes (Signed)
 04/19/2024, 1:54 PM  Endocrinology follow-up note   Subjective:    Patient ID: Rachel Stewart, female    DOB: 1971/04/29.  Rachel Stewart is being seen in follow-up after she was seen in consultation for management of currently uncontrolled symptomatic diabetes requested by  Joshua Lyons, NP.   Past Medical History:  Diagnosis Date   Anemia    Anxiety    Arthritis    Bronchitis, chronic (HCC)    last tx. 8'17   Chronic kidney disease    Complication of anesthesia    hard to wake up after anesthesia   Diabetes mellitus without complication (HCC)    GERD (gastroesophageal reflux disease)    History of kidney stones    Hypertension    Neuromuscular disorder (HCC)    tic pain left face near ear area. Carpal tunnel bilateral. Arthritis knee.   Sleep apnea    Ventral hernia    at present    Past Surgical History:  Procedure Laterality Date   BREAST SURGERY     breast reduction   CHOLECYSTECTOMY N/A 02/17/2023   Procedure: LAPAROSCOPIC CHOLECYSTECTOMY WITH INTRAOPERATIVE CHOLANGIOGRAM Closure of retro-roux mesenteric defect;  Surgeon: Lyndel Deward PARAS, MD;  Location: [facility];  Service: General;  Laterality: N/A;   CYST EXCISION     left side of neck,  Benign cyst   LAPAROSCOPIC ROUX-EN-Y GASTRIC BYPASS WITH HIATAL HERNIA REPAIR N/A 05/19/2016   Procedure: LAPAROSCOPIC ROUX-EN-Y GASTRIC BYPASS WITH HIATAL HERNIA REPAIR, UPPER ENDO;  Surgeon: Donnice Lunger, MD;  Location: [facility];  Service: General;  Laterality: N/A;   SPINE SURGERY     lumbar decompression    Social History   Socioeconomic History   Marital status: Married    Spouse name: Not on file   Number of children: Not on file   Years of education: Not on file   Highest education level: Not on file  Occupational History   Not on file  Tobacco Use   Smoking status: Never   Smokeless tobacco: Never  Vaping Use   Vaping status: Never Used  Substance and Sexual Activity    Alcohol use: No   Drug use: No   Sexual activity: Yes  Other Topics Concern   Not on file  Social History Narrative   Not on file   Social Drivers of Health   Financial Resource Strain: Not on file  Food Insecurity: Not on file  Transportation Needs: Not on file  Physical Activity: Not on file  Stress: Not on file  Social Connections: Not on file    Family History  Problem Relation Age of Onset   Aneurysm Mother    Diabetes Mother    Hypertension Sister     Outpatient Encounter Medications as of 04/19/2024  Medication Sig   levETIRAcetam (KEPPRA) 500 MG tablet Take 500 mg by mouth 2 (two) times daily. (Patient taking differently: Take 500 mg by mouth daily.)   rOPINIRole (REQUIP) 0.25 MG tablet Take 0.25 mg by mouth at bedtime.   tirzepatide  (MOUNJARO ) 12.5 MG/0.5ML Pen Inject 12.5 mg into the skin once a week.   Accu-Chek Softclix Lancets lancets Use as instructed to test blood glucose two times daily. Once in the morning and once at bedtime.   albuterol (VENTOLIN HFA) 108 (90 Base) MCG/ACT inhaler Inhale 2 puffs into the lungs every 6 (six) hours as needed for wheezing or shortness of breath.   atorvastatin (LIPITOR) 40 MG tablet Take  1 tablet by mouth daily.   Blood Glucose Monitoring Suppl (ACCU-CHEK GUIDE) w/Device KIT Use to test blood glucose two times daily. Once in the morning and once at bedtime.   BREO ELLIPTA 200-25 MCG/ACT AEPB Inhale 1 puff into the lungs daily.   carvedilol (COREG) 3.125 MG tablet Take 1 tablet (3.125 mg total) by mouth 2 (two) times daily with a meal.   Cholecalciferol (VITAMIN D3) 125 MCG (5000 UT) CAPS Take 1 capsule (5,000 Units total) by mouth daily.   Continuous Glucose Receiver (FREESTYLE LIBRE 3 READER) DEVI 1 Piece by Does not apply route once as needed for up to 1 dose. E11.65   Continuous Glucose Sensor (FREESTYLE LIBRE 3 PLUS SENSOR) MISC CHANGE SENSOR EVERY 15 DAYS.   cyanocobalamin (,VITAMIN B-12,) 1000 MCG/ML injection Inject 1,000  mcg into the muscle every 30 (thirty) days.   ferrous sulfate 325 (65 FE) MG tablet Take 650 mg by mouth daily with breakfast.   glucose blood (ACCU-CHEK GUIDE) test strip Use as instructed   JARDIANCE  10 MG TABS tablet TAKE 1 TABLET BY MOUTH DAILY BEFORE BREAKFAST.   losartan-hydrochlorothiazide (HYZAAR) 50-12.5 MG tablet Take 1 tablet by mouth daily.   meloxicam (MOBIC) 15 MG tablet Take 15 mg by mouth daily as needed for pain.   Multiple Vitamin (MULTIVITAMIN WITH MINERALS) TABS tablet Take 1 tablet by mouth daily.   omeprazole (PRILOSEC) 40 MG capsule Take 40 mg by mouth every morning.   tiZANidine (ZANAFLEX) 2 MG tablet Take 1 tablet by mouth at bedtime as needed for muscle spasms.   [DISCONTINUED] carvedilol (COREG) 6.25 MG tablet Take 6.25 mg by mouth 2 (two) times daily with a meal.   [DISCONTINUED] tirzepatide  (MOUNJARO ) 10 MG/0.5ML Pen Inject 10 mg into the skin once a week.   No facility-administered encounter medications on file as of 04/19/2024.    ALLERGIES: Allergies  Allergen Reactions   Carbamazepine, Eslicarbazepine, And Oxcarbazepine    Lisinopril Cough    VACCINATION STATUS: Immunization History  Administered Date(s) Administered   Moderna Sars-Covid-2 Vaccination 09/14/2019, 10/12/2019    Diabetes She presents for her follow-up diabetic visit. She has type 2 diabetes mellitus. Onset time: She was diagnosed at approximate age of 33 years. Her disease course has been improving. There are no hypoglycemic associated symptoms. Pertinent negatives for hypoglycemia include no confusion, headaches, pallor or seizures. Pertinent negatives for diabetes include no chest pain, no fatigue, no polydipsia, no polyphagia and no polyuria. There are no hypoglycemic complications. Symptoms are improving. There are no diabetic complications. Risk factors for coronary artery disease include diabetes mellitus, hypertension, obesity and sedentary lifestyle. She is compliant with treatment  none of the time. Her weight is decreasing steadily (Patient with history of moderate obesity, weight up to 275 pounds before she underwent Roux-en-Y gastric bypass in 2006 which allowed her to lose more than 100 pounds, recently losing again due to GLP-1 receptor agonist.). She is following a generally unhealthy diet. When asked about meal planning, she reported none. She has not had a previous visit with a dietitian. She participates in exercise intermittently. Her home blood glucose trend is decreasing steadily. Her overall blood glucose range is 140-180 mg/dl. (Once again, she did not bring any logs or meter.  Her point-of-care A1c is improving to 6.8%.  She denies any hypoglycemia.    ) An ACE inhibitor/angiotensin II receptor blocker is being taken. Eye exam is current.  Hypertension This is a chronic problem. The current episode started more than 1 year  ago. The problem is controlled. Pertinent negatives include no chest pain, headaches, palpitations or shortness of breath. Risk factors for coronary artery disease include diabetes mellitus, sedentary lifestyle and obesity. Past treatments include angiotensin blockers.     Objective:       04/19/2024    8:58 AM 12/10/2023   10:43 AM 12/10/2023   10:22 AM  Vitals with BMI  Height 5' 4  5' 4  Weight 190 lbs  197 lbs 6 oz  BMI 32.6  33.87  Systolic 90 86 86  Diastolic 68 64 60  Pulse 88  96    BP 90/68   Pulse 88   Ht 5' 4 (1.626 m)   Wt 190 lb (86.2 kg)   BMI 32.61 kg/m   Wt Readings from Last 3 Encounters:  04/19/24 190 lb (86.2 kg)  12/10/23 197 lb 6.4 oz (89.5 kg)  07/27/23 200 lb 12.8 oz (91.1 kg)     CMP ( most recent) CMP     Component Value Date/Time   NA 138 04/13/2024 0000   K 3.7 04/13/2024 0000   CL 104 02/13/2023 1349   CO2 24 02/13/2023 1349   GLUCOSE 151 (H) 02/13/2023 1349   BUN 19 04/13/2024 0000   CREATININE 0.9 04/13/2024 0000   CREATININE 0.77 02/13/2023 1349   CREATININE 0.76 06/18/2018 1608    CALCIUM 9.3 04/13/2024 0000   CALCIUM 9.8 07/23/2023 0000   PROT 7.4 07/16/2022 1436   ALBUMIN  3.5 (L) 07/16/2022 1436   AST 17 07/16/2022 1436   ALT 27 07/16/2022 1436   ALKPHOS 101 04/13/2024 0000   BILITOT 0.3 07/16/2022 1436   GFRNONAA >60 02/13/2023 1349   GFRNONAA 92 06/18/2018 1608   GFRAA >60 08/15/2019 0000   GFRAA 107 06/18/2018 1608     Diabetic Labs (most recent): Lab Results  Component Value Date   HGBA1C 7.2 (A) 12/10/2023   HGBA1C 7.4 (H) 02/13/2023   HGBA1C 8.6 (A) 12/15/2022     Lipid Panel ( most recent) Lipid Panel     Component Value Date/Time   CHOL 89 07/23/2023 0000   CHOL 167 07/16/2022 1436   TRIG 73 07/23/2023 0000   HDL 49 07/23/2023 0000   HDL 47 07/16/2022 1436   CHOLHDL 3.6 07/16/2022 1436   CHOLHDL 4 06/18/2018 1608   VLDL 25.4 06/18/2018 1608   LDLCALC 25 07/23/2023 0000   LDLCALC 87 07/16/2022 1436   LABVLDL 33 07/16/2022 1436      Lab Results  Component Value Date   TSH 0.94 04/13/2024   TSH 0.766 12/23/2021   TSH 0.957 02/26/2021   TSH 0.836 08/16/2020   TSH 0.93 08/15/2019   FREET4 1.33 04/13/2024   FREET4 1.24 12/23/2021   FREET4 1.23 02/26/2021   FREET4 1.30 08/16/2020      Assessment & Plan:   1. Type 2 diabetes mellitus without complication, without long-term current use of insulin  (HCC)  - Aloha N Delahunt has currently uncontrolled symptomatic type 2 DM since  53 years of age.  Once again, she did not bring any logs or meter.  Her point-of-care A1c is improving to 6.8%.  She denies any hypoglycemia.    - I had a long discussion with her about the progressive nature of diabetes and the pathology behind its complications. -her diabetes is complicated by obesity and she remains at a high risk for more acute and chronic complications which include CAD, CVA, CKD, retinopathy, and neuropathy. These are all discussed in detail with  her.  - I have counseled her on diet  and weight management  by adopting a  carbohydrate restricted/protein rich diet. Patient is encouraged to switch to  unprocessed or minimally processed     complex starch and increased protein intake (animal or plant source), fruits, and vegetables. -  she is advised to stick to a routine mealtimes to eat 3 meals  a day and avoid unnecessary snacks ( to snack only to correct hypoglycemia).   - she acknowledges that there is a room for improvement in her food and drink choices. - Suggestion is made for her to avoid simple carbohydrates  from her diet including Cakes, Sweet Desserts, Ice Cream, Soda (diet and regular), Sweet Tea, Candies, Chips, Cookies, Store Bought Juices, Alcohol , Artificial Sweeteners,  Coffee Creamer, and Sugar-free Products, Lemonade. This will help patient to have more stable blood glucose profile and potentially avoid unintended weight gain.  - I have approached her with the following individualized plan to manage  her diabetes and patient agrees:   -In light of her known non-commitment for proper monitoring of blood glucose for safe use of medications, she presents a challenge for optimal treatment of her diabetes.    - To minimize her risk of hypoglycemia from inadvertent use of hypoglycemic agents she will be kept on non-insulin  treatment.   - She is responding to her current regimen of GLP-1 receptor agonists and SGLT2 inhibitors.  I discussed and increase her Mounjaro  to 12.5 mg subcutaneously weekly.  Side effects and precaution discussed with her.  This medication will be advanced as she tolerates.    -She is also advised to continue Jardiance  10 mg p.o. daily at breakfast   -She is urged to start monitoring blood glucose twice a day-daily before breakfast and at bedtime.  She also did not pick up her previous prescription for a CGM.     - she is encouraged to call clinic for blood glucose levels less than 70 or above 150 mg daily at on 3 days a week fasting.    - Specific targets for  A1c;  LDL, HDL,   and Triglycerides were discussed with the patient.  2) Blood Pressure /Hypertension:  Her blood pressure is controlled to target.  Since she registered marginal blood pressure on 2 separate visits here, I discussed and lowered her carvedilol to 3.125 mg p.o. twice daily and advised to continue her losartan/HCTZ 50-12.5 mg p.o. daily.    3) Lipids/Hyperlipidemia:   Her previsit lipid panel showed controlled LDL at 25.  She remains on Lipitor 40 mg p.o. nightly.   4)  Weight/Diet:  Body mass index is 32.61 kg/m.  -   clearly complicating her diabetes care.   she is  a candidate for weight loss. I discussed with her the fact that loss of 5 - 10% of her  current body weight will have the most impact on her diabetes management.  Exercise, and detailed carbohydrates information provided  -  detailed on discharge instructions.  Patient has lost more than 100 pounds with her to her last gastric bypass, currently retaining.  5) Chronic Care/Health Maintenance:  -she  is on ARB medications and  is encouraged to initiate and continue to follow up with Ophthalmology, Dentist,  Podiatrist at least yearly or according to recommendations, and advised to   stay away from smoking. I have recommended yearly flu vaccine and pneumonia vaccine at least every 5 years; moderate intensity exercise for up to 150 minutes weekly;  and  sleep for at least 7 hours a day. Her screening ABI was normal in March 2022.  Her study will be repeated in March 2027 or sooner if needed.   - she is  advised to maintain close follow up with Joshua Lyons, NP for primary care needs, as well as her other providers for optimal and coordinated care.   I spent  26  minutes in the care of the patient today including review of labs from CMP, Lipids, Thyroid Function, Hematology (current and previous including abstractions from other facilities); face-to-face time discussing  her blood glucose readings/logs, discussing hypoglycemia and  hyperglycemia episodes and symptoms, medications doses, her options of short and long term treatment based on the latest standards of care / guidelines;  discussion about incorporating lifestyle medicine;  and documenting the encounter. Risk reduction counseling performed per USPSTF guidelines to reduce  obesity and cardiovascular risk factors.     Please refer to Patient Instructions for Blood Glucose Monitoring and Insulin /Medications Dosing Guide  in media tab for additional information. Please  also refer to  Patient Self Inventory in the Media  tab for reviewed elements of pertinent patient history.  Rachel Stewart participated in the discussions, expressed understanding, and voiced agreement with the above plans.  All questions were answered to her satisfaction. she is encouraged to contact clinic should she have any questions or concerns prior to her return visit.      Follow up plan: - Return in about 4 months (around 08/17/2024) for Bring Meter/CGM Device/Logs- A1c in Office.  Ranny Earl, MD [facility] [address] [place], [place] [address] Phone: [phone]  Fax: [phone]    04/19/2024, 1:54 PM  This note was partially dictated with voice recognition software. Similar sounding words can be transcribed inadequately or may not  be corrected upon review.

## 2024-04-19 NOTE — Patient Instructions (Signed)

## 2024-05-25 ENCOUNTER — Other Ambulatory Visit: Payer: Self-pay | Admitting: "Endocrinology

## 2024-07-10 ENCOUNTER — Other Ambulatory Visit: Payer: Self-pay | Admitting: "Endocrinology

## 2024-08-18 ENCOUNTER — Ambulatory Visit: Admitting: "Endocrinology
